# Patient Record
Sex: Male | Born: 1965 | Race: White | Hispanic: No | Marital: Married | Smoking: Never smoker
Health system: Southern US, Community
[De-identification: ages and names within clinical notes are randomized; demographics above are authoritative.]

## PROBLEM LIST (undated history)

## (undated) DIAGNOSIS — F32A Depression, unspecified: Secondary | ICD-10-CM

## (undated) DIAGNOSIS — I10 Essential (primary) hypertension: Secondary | ICD-10-CM

## (undated) HISTORY — DX: Essential (primary) hypertension: I10

---

## 1998-12-10 ENCOUNTER — Ambulatory Visit (HOSPITAL_COMMUNITY): Admission: RE | Admit: 1998-12-10 | Discharge: 1998-12-10 | Payer: Self-pay | Admitting: *Deleted

## 2000-05-03 ENCOUNTER — Ambulatory Visit (HOSPITAL_BASED_OUTPATIENT_CLINIC_OR_DEPARTMENT_OTHER): Admission: RE | Admit: 2000-05-03 | Discharge: 2000-05-03 | Payer: Self-pay | Admitting: General Surgery

## 2000-09-27 ENCOUNTER — Encounter: Payer: Self-pay | Admitting: Gastroenterology

## 2000-09-27 ENCOUNTER — Encounter: Admission: RE | Admit: 2000-09-27 | Discharge: 2000-09-27 | Payer: Self-pay | Admitting: Gastroenterology

## 2000-11-06 ENCOUNTER — Other Ambulatory Visit: Admission: RE | Admit: 2000-11-06 | Discharge: 2000-11-06 | Payer: Self-pay | Admitting: Gastroenterology

## 2003-06-18 ENCOUNTER — Ambulatory Visit (HOSPITAL_BASED_OUTPATIENT_CLINIC_OR_DEPARTMENT_OTHER): Admission: RE | Admit: 2003-06-18 | Discharge: 2003-06-18 | Payer: Self-pay | Admitting: Orthopedic Surgery

## 2019-01-13 ENCOUNTER — Other Ambulatory Visit: Payer: Self-pay | Admitting: Psychiatry

## 2019-01-13 NOTE — Telephone Encounter (Signed)
Not seen in epic to review

## 2019-01-13 NOTE — Telephone Encounter (Signed)
Need to review paper chart not seen in epic  

## 2019-01-13 NOTE — Telephone Encounter (Signed)
[facility] [address] requests refill Wellbutrin 300 mg XL as a 90-day supply patient stating he is taking it at night along with the Lexapro 20 mg nightly.  I call reminding him since last appointment May 16, 2018 that he did not expect more benefit from treatment but wanted to continue his medications to return in 3 to 12 months.  He last filled Xanax 07/22/2018 0.5 mg times daily as needed as a month supply.  The patient laughs on the phone and is pleasant stating he is doing enough and thanking me for checking on him stating he does wish to continue his medications stopping therapy 06/27/2018.  Bupropion 300 mg XL every night #90 no refill is sent to [facility] [address] medically necessary no contraindication.

## 2019-01-14 ENCOUNTER — Other Ambulatory Visit: Payer: Self-pay | Admitting: Psychiatry

## 2019-01-14 NOTE — Telephone Encounter (Signed)
Received request for his wellbutrin yesterday, and this one today. Not seen in epic

## 2019-01-14 NOTE — Telephone Encounter (Signed)
Phone call to [facility] yesterday about his request for Wellbutrin refill taking 300 mg XL nightly now today needing Lexapro 20 mg nightly sent as a 90-day supply also with no refill, 8 months since last appointment agreeing to follow-up in 3 to 12 months then, therefore 60-month supply is sent to [facility] [address] medically necessary no contraindication.

## 2019-03-31 ENCOUNTER — Other Ambulatory Visit: Payer: Self-pay | Admitting: Internal Medicine

## 2019-03-31 DIAGNOSIS — N289 Disorder of kidney and ureter, unspecified: Secondary | ICD-10-CM

## 2019-04-07 ENCOUNTER — Other Ambulatory Visit: Payer: Self-pay | Admitting: Psychiatry

## 2019-04-07 NOTE — Telephone Encounter (Signed)
Last office appointment was 05/16/2018 as he requested to continue the Lexapro 20 mg nightly but return in 3 to 12 months doing better when last discussed by phone but having stopped therapy.  Will provide a 90-day refill with pharmacy clarification last possible without appointment.

## 2019-04-07 NOTE — Telephone Encounter (Signed)
Not seen in epic, no scheduled appts

## 2019-04-08 ENCOUNTER — Other Ambulatory Visit: Payer: Self-pay | Admitting: Psychiatry

## 2019-04-09 ENCOUNTER — Ambulatory Visit
Admission: RE | Admit: 2019-04-09 | Discharge: 2019-04-09 | Disposition: A | Discharge status: Home / Self Care | Payer: Managed Care, Other (non HMO) | Source: Ambulatory Visit | Attending: Internal Medicine | Admitting: Internal Medicine

## 2019-04-09 DIAGNOSIS — N289 Disorder of kidney and ureter, unspecified: Secondary | ICD-10-CM

## 2019-06-29 ENCOUNTER — Other Ambulatory Visit: Payer: Self-pay | Admitting: Psychiatry

## 2019-07-01 ENCOUNTER — Other Ambulatory Visit: Payer: Self-pay | Admitting: Psychiatry

## 2019-07-28 IMAGING — US US RENAL
1 series · 14 of 25 positions shown · non-contrast
Comparison: None

CLINICAL DATA: Renal insufficiency

EXAM:
RENAL / URINARY TRACT ULTRASOUND COMPLETE

[Series 1: us renal · 0.28mm/px · 14 of 42 slices shown]
[im 1/42]
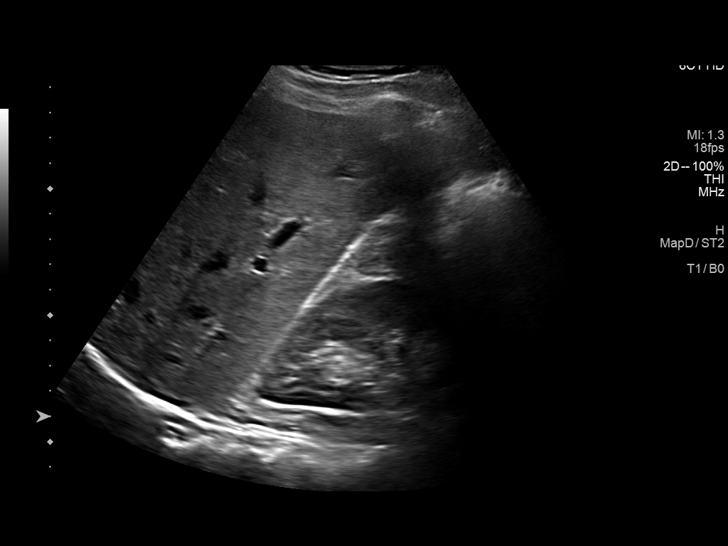
[im 4/42]
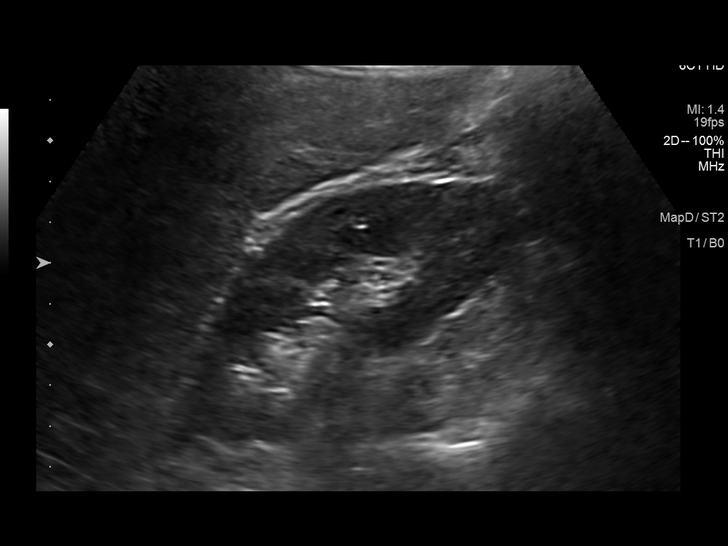
[im 7/42]
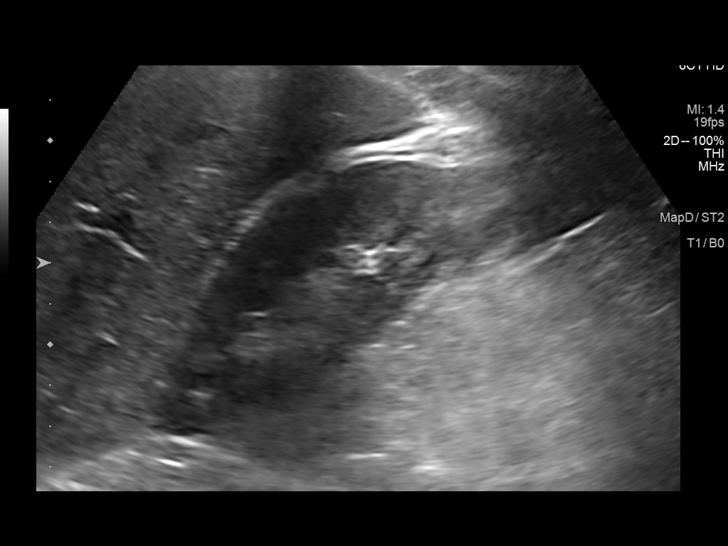
[im 11/42]
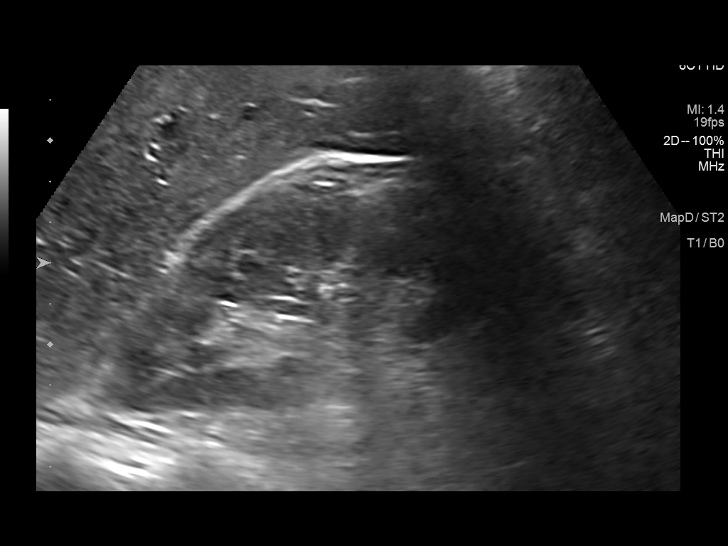
[im 14/42]
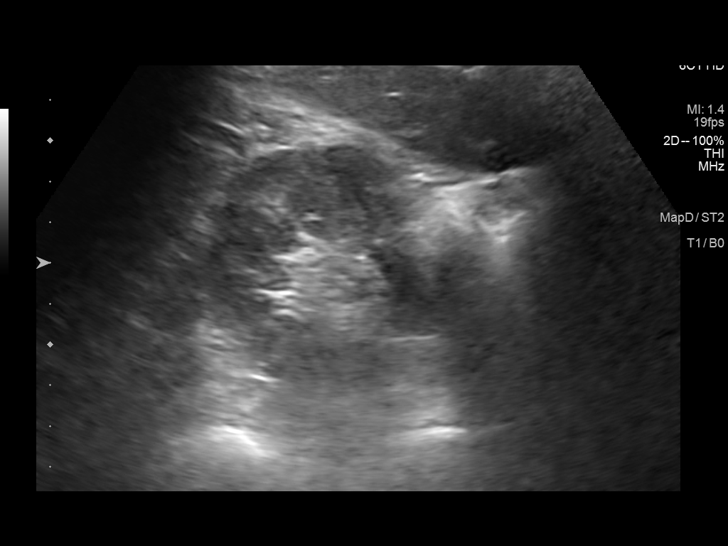
[im 16/42]
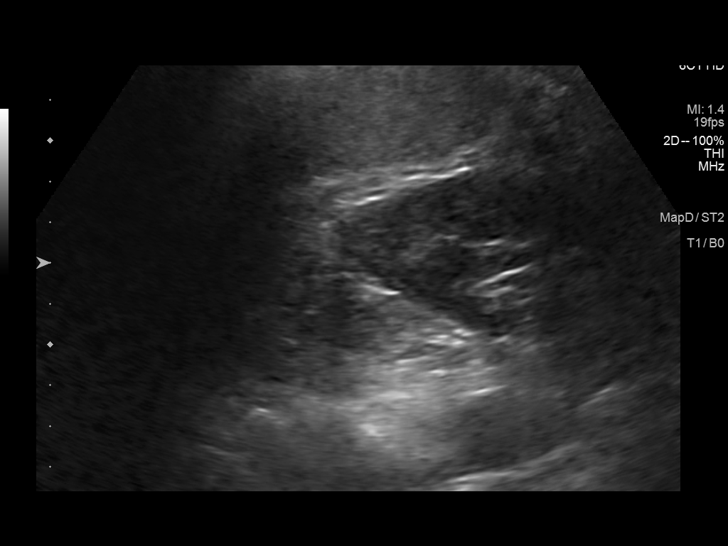
[im 19/42]
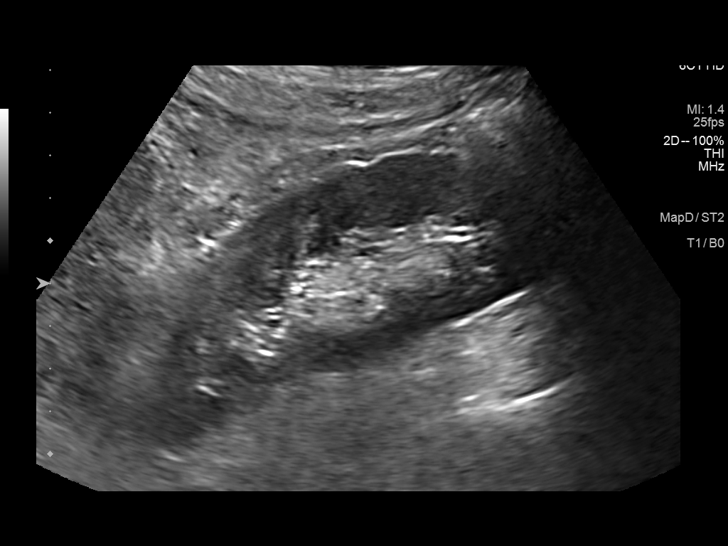
[im 23/42]
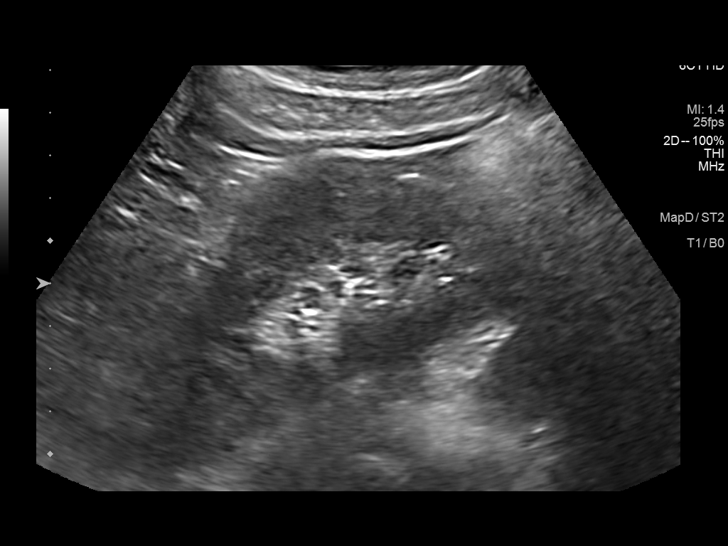
[im 26/42]
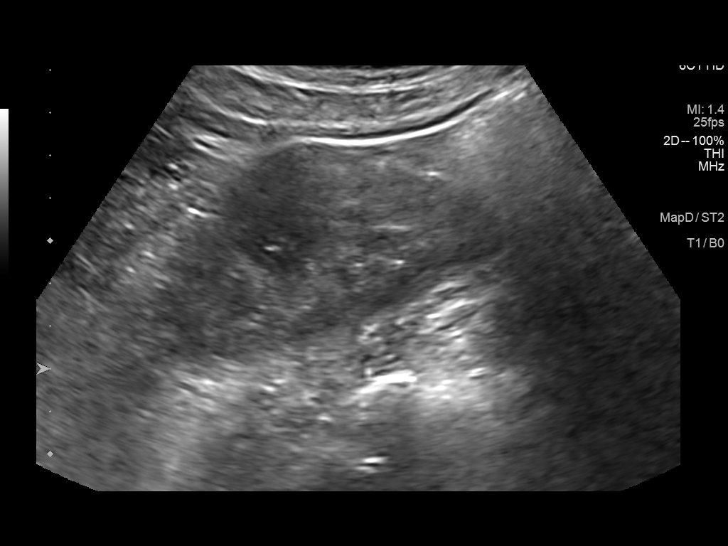
[im 28/42]
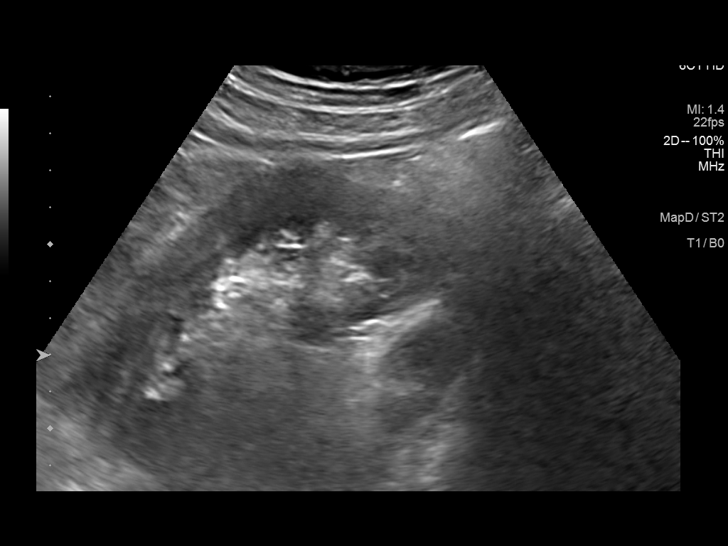
[im 31/42]
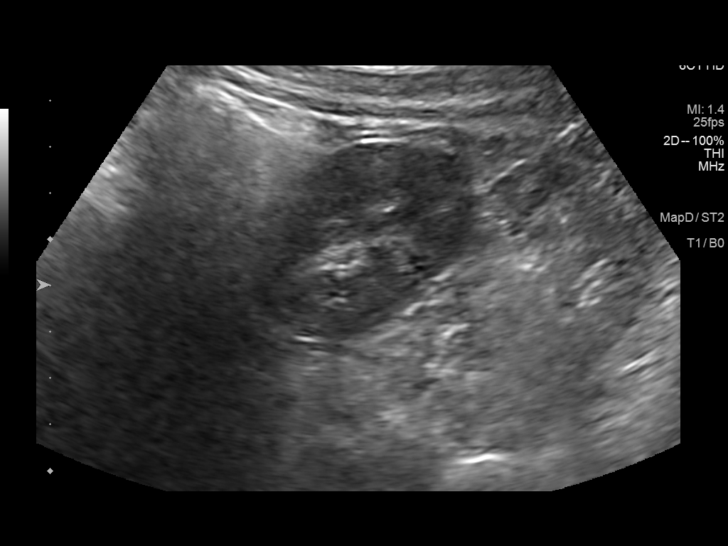
[im 35/42]
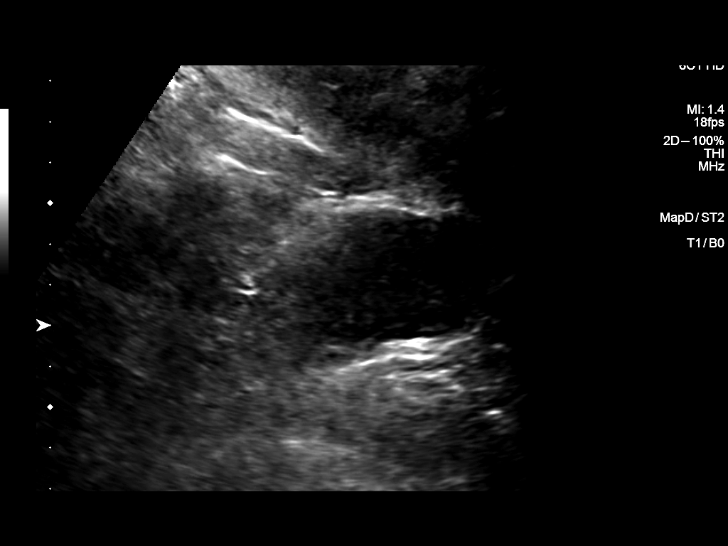
[im 38/42]
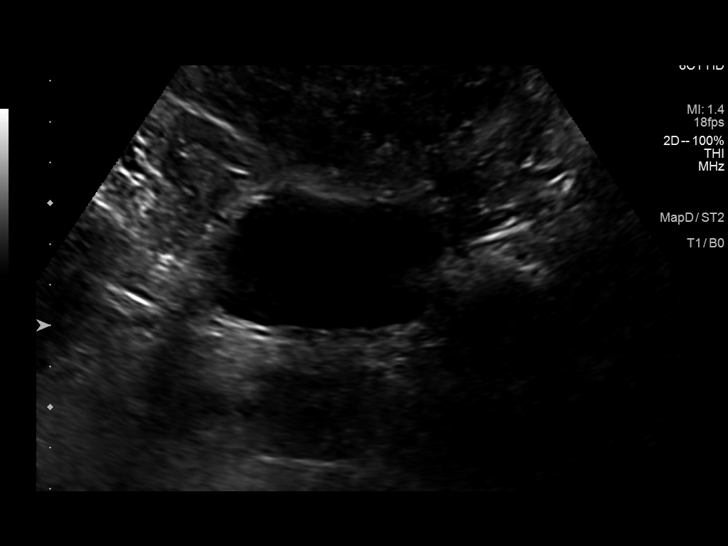
[im 42/42]
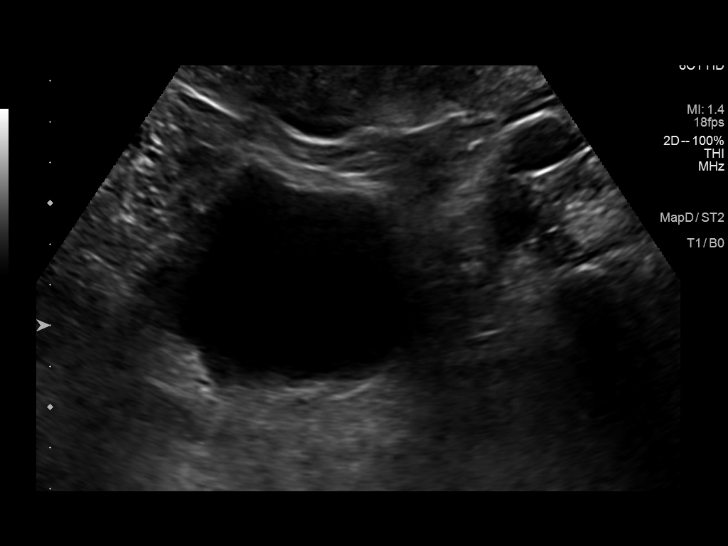

[14 of 25 positions shown; findings below may reference images not displayed]

FINDINGS: Right Kidney:

Renal measurements: 9.9 x 3.9 x 4.0 cm = volume: 81 mL. Normal
cortical thickness. Upper normal cortical echogenicity. No mass,
hydronephrosis or shadowing calcification.

Left Kidney:

Renal measurements: 10.7 x 4.3 x 5.4 cm = volume: 129 mL. Normal
cortical thickness. Upper normal cortical echogenicity. No mass,
hydronephrosis, or shadowing calcification.

Bladder:

Appears normal for degree of bladder distention.

Prostate gland measures 3.9 x 2.4 x 3.9 cm.
IMPRESSION: No renal sonographic abnormalities identified.

## 2019-10-10 ENCOUNTER — Ambulatory Visit: Payer: Managed Care, Other (non HMO) | Attending: Internal Medicine

## 2019-10-10 DIAGNOSIS — Z20822 Contact with and (suspected) exposure to covid-19: Secondary | ICD-10-CM

## 2019-10-12 ENCOUNTER — Telehealth: Payer: Self-pay | Admitting: Physician Assistant

## 2019-10-12 LAB — NOVEL CORONAVIRUS, NAA: SARS-CoV-2, NAA: DETECTED — AB

## 2019-10-12 NOTE — Telephone Encounter (Signed)
Patient was contacted regarding positive covid 19 result. Pt's symptoms include cold like symptoms which started around 10/08/19. No shortness of breath. We went over CDC guidelines for quarantine and ER precautions.    Cline Crock PA-C  [facility]

## 2020-07-22 ENCOUNTER — Encounter: Payer: Self-pay | Admitting: Psychiatry

## 2020-10-03 ENCOUNTER — Other Ambulatory Visit: Payer: Self-pay

## 2020-10-03 ENCOUNTER — Encounter (HOSPITAL_COMMUNITY): Payer: Self-pay | Admitting: *Deleted

## 2020-10-03 ENCOUNTER — Emergency Department (HOSPITAL_COMMUNITY): Payer: BLUE CROSS/BLUE SHIELD

## 2020-10-03 ENCOUNTER — Emergency Department (HOSPITAL_COMMUNITY)
Admission: EM | Admit: 2020-10-03 | Discharge: 2020-10-03 | Disposition: A | Discharge status: Home / Self Care | Payer: BLUE CROSS/BLUE SHIELD | Attending: Emergency Medicine | Admitting: Emergency Medicine

## 2020-10-03 DIAGNOSIS — R519 Headache, unspecified: Secondary | ICD-10-CM | POA: Diagnosis not present

## 2020-10-03 DIAGNOSIS — Z20822 Contact with and (suspected) exposure to covid-19: Secondary | ICD-10-CM | POA: Diagnosis not present

## 2020-10-03 DIAGNOSIS — R569 Unspecified convulsions: Secondary | ICD-10-CM | POA: Insufficient documentation

## 2020-10-03 DIAGNOSIS — R251 Tremor, unspecified: Secondary | ICD-10-CM | POA: Diagnosis not present

## 2020-10-03 HISTORY — DX: Depression, unspecified: F32.A

## 2020-10-03 LAB — CBC
HCT: 45.6 % (ref 39.0–52.0)
Hemoglobin: 14.8 g/dL (ref 13.0–17.0)
MCH: 29.5 pg (ref 26.0–34.0)
MCHC: 32.5 g/dL (ref 30.0–36.0)
MCV: 91 fL (ref 80.0–100.0)
Platelets: 229 10*3/uL (ref 150–400)
RBC: 5.01 MIL/uL (ref 4.22–5.81)
RDW: 12 % (ref 11.5–15.5)
WBC: 13.8 10*3/uL — ABNORMAL HIGH (ref 4.0–10.5)
nRBC: 0 % (ref 0.0–0.2)

## 2020-10-03 LAB — HEPATIC FUNCTION PANEL
ALT: 58 U/L — ABNORMAL HIGH (ref 0–44)
AST: 34 U/L (ref 15–41)
Albumin: 3.8 g/dL (ref 3.5–5.0)
Alkaline Phosphatase: 74 U/L (ref 38–126)
Bilirubin, Direct: 0.2 mg/dL (ref 0.0–0.2)
Indirect Bilirubin: 0.7 mg/dL (ref 0.3–0.9)
Total Bilirubin: 0.9 mg/dL (ref 0.3–1.2)
Total Protein: 6.3 g/dL — ABNORMAL LOW (ref 6.5–8.1)

## 2020-10-03 LAB — BASIC METABOLIC PANEL
Anion gap: 8 (ref 5–15)
BUN: 13 mg/dL (ref 6–20)
CO2: 24 mmol/L (ref 22–32)
Calcium: 9 mg/dL (ref 8.9–10.3)
Chloride: 104 mmol/L (ref 98–111)
Creatinine, Ser: 1.42 mg/dL — ABNORMAL HIGH (ref 0.61–1.24)
GFR, Estimated: 59 mL/min — ABNORMAL LOW (ref >60)
Glucose, Bld: 126 mg/dL — ABNORMAL HIGH (ref 70–99)
Potassium: 3.8 mmol/L (ref 3.5–5.1)
Sodium: 136 mmol/L (ref 135–145)

## 2020-10-03 LAB — ETHANOL: Alcohol, Ethyl (B): <10 mg/dL (ref <10)

## 2020-10-03 LAB — URINALYSIS, ROUTINE W REFLEX MICROSCOPIC
Bilirubin Urine: NEGATIVE
Glucose, UA: NEGATIVE mg/dL
Ketones, ur: NEGATIVE mg/dL
Leukocytes,Ua: NEGATIVE
Nitrite: NEGATIVE
Protein, ur: NEGATIVE mg/dL
Specific Gravity, Urine: 1.016 (ref 1.005–1.030)
pH: 5 (ref 5.0–8.0)

## 2020-10-03 LAB — TROPONIN I (HIGH SENSITIVITY)
Troponin I (High Sensitivity): 13 ng/L (ref <18)
Troponin I (High Sensitivity): 13 ng/L (ref <18)

## 2020-10-03 LAB — RAPID URINE DRUG SCREEN, HOSP PERFORMED
Amphetamines: NOT DETECTED
Barbiturates: NOT DETECTED
Benzodiazepines: NOT DETECTED
Cocaine: NOT DETECTED
Opiates: NOT DETECTED
Tetrahydrocannabinol: NOT DETECTED

## 2020-10-03 LAB — CBG MONITORING, ED: Glucose-Capillary: 114 mg/dL — ABNORMAL HIGH (ref 70–99)

## 2020-10-03 LAB — RESP PANEL BY RT-PCR (FLU A&B, COVID) ARPGX2
Influenza A by PCR: NEGATIVE
Influenza B by PCR: NEGATIVE
SARS Coronavirus 2 by RT PCR: NEGATIVE

## 2020-10-03 LAB — MAGNESIUM: Magnesium: 1.9 mg/dL (ref 1.7–2.4)

## 2020-10-03 MED ORDER — LACTATED RINGERS IV BOLUS
1000.0000 mL | Freq: Once | INTRAVENOUS | Status: AC
  Administered 2020-10-03: 1000 mL via INTRAVENOUS

## 2020-10-03 NOTE — ED Notes (Signed)
Updated wife [name].

## 2020-10-03 NOTE — Discharge Instructions (Addendum)
Per [facility] statutes, patients with seizures are not allowed to drive until  they have been seizure-free for six months. Use caution when using heavy equipment or power tools. Avoid working on ladders or at heights. Take showers instead of baths. Ensure the water temperature is not too high on the home water heater. Do not go swimming alone. When caring for infants or small children, sit down when holding, feeding, or changing them to minimize risk of injury to the child in the event you have a seizure.   Also, maintain good sleep hygiene. Avoid alcohol.   --> Call 911 and bring the patient back to the ED if:                   A.  The seizure lasts longer than 5 minutes.                  B.  The patient doesn't awaken shortly after the seizure             C.  The patient has new problems such as difficulty seeing, speaking or moving             D.  The patient was injured during the seizure             E.  The patient has a temperature over 102 F (39C)             F.  The patient vomited and now is having trouble breathing  Recommend follow-up with neurology on this matter.  Call to make an appointment. Make sure your primary care provider is also aware of this event.

## 2020-10-03 NOTE — ED Provider Notes (Signed)
[facility] EMERGENCY DEPARTMENT Provider Note   CSN: [id number] Arrival date & time: 10/03/20  1557     History Chief Complaint  Patient presents with  . Seizures    Jerome Hall is a 55 y.o. male.  HPI      Jerome Hall is a 55 y.o. male, with a history of depression, presenting to the ED with seizure versus syncope event that occurred around 3 PM today.  Patient states he remembers standing outside, experienced "flickering" in his vision, bent down, and this is the last thing he remembers. Patient's wife and 20 year old son added to the history.  Son states he saw the patient standing outside holding the water hose (patient clarifies that he was rinsing off the car), he went outside to see what his dad was doing, and found him lying on the ground with "stiff, generalized shaking with foaming from the mouth."  Son ran inside and came back with patient's wife.  Wife states patient was no longer shaking when she arrived at his side, but was not responding and seemed to have labored breathing with foaming around the mouth.  EMS reports patient confused upon their arrival, not following commands, and mildly combative.  EMS reports urinary incontinence as well.  Patient endorses a mild, generalized headache, but states he otherwise feels fine.  He denies any history of seizure.  Denies alcohol or illicit drug use.  States he has not eaten all day.   Denies fever/chills, recent illness, neck pain/stiffness, back pain, chest pain, shortness of breath, abdominal pain, numbness, weakness, or any other complaints.   Past Medical History:  Diagnosis Date  . Depression     There are no problems to display for this patient.   History reviewed. No pertinent surgical history.     No family history on file.  Social History   Tobacco Use  . Smoking status: Never Smoker  Substance Use Topics  . Alcohol use: Yes    Comment: occ  . Drug use: Never     Home Medications Prior to Admission medications   Medication Sig Start Date End Date Taking? Authorizing Provider  buPROPion (WELLBUTRIN XL) 300 MG 24 hr tablet TAKE 1 TABLET BY MOUTH EVERY DAY IN THE MORNING 04/08/19   Chauncey Mann, MD  escitalopram (LEXAPRO) 20 MG tablet TAKE 1 TABLET BY MOUTH EVERYDAY AT BEDTIME 04/07/19   Chauncey Mann, MD    Allergies    Patient has no known allergies.  Review of Systems   Review of Systems  Constitutional: Negative for chills, diaphoresis and fever.  Respiratory: Negative for cough and shortness of breath.   Cardiovascular: Negative for chest pain.  Gastrointestinal: Negative for abdominal pain, diarrhea, nausea and vomiting.  Genitourinary: Negative for dysuria.  Musculoskeletal: Negative for back pain and neck pain.  Neurological: Positive for seizures and headaches. Negative for speech difficulty.  All other systems reviewed and are negative.   Physical Exam Updated Vital Signs BP (!) 160/95   Pulse 97   Temp 98.1 F (36.7 C) (Oral)   Resp (!) 29   Ht 6\' 4"  (1.93 m)   Wt 94.3 kg   SpO2 95%   BMI 25.32 kg/m   Physical Exam Vitals and nursing note reviewed.  Constitutional:      General: He is not in acute distress.    Appearance: He is well-developed. He is not diaphoretic.  HENT:     Head: Normocephalic and atraumatic.  Comments: Scalp and face were examined without noted swelling, deformity, instability, or wounds.    Nose: Nose normal.     Mouth/Throat:     Mouth: Mucous membranes are moist.     Pharynx: Oropharynx is clear.     Comments: No noted intraoral trauma. Eyes:     Conjunctiva/sclera: Conjunctivae normal.  Cardiovascular:     Rate and Rhythm: Normal rate and regular rhythm.     Pulses: Normal pulses.          Radial pulses are 2+ on the right side and 2+ on the left side.       Posterior tibial pulses are 2+ on the right side and 2+ on the left side.     Heart sounds: Normal heart sounds.      Comments: Tactile temperature in the extremities appropriate and equal bilaterally. Pulmonary:     Effort: Pulmonary effort is normal. No respiratory distress.     Breath sounds: Normal breath sounds.  Abdominal:     Palpations: Abdomen is soft.     Tenderness: There is no abdominal tenderness. There is no guarding.  Musculoskeletal:     Cervical back: Neck supple.     Right lower leg: No edema.     Left lower leg: No edema.     Comments: Normal motor function intact in all extremities. No midline spinal tenderness.   Lymphadenopathy:     Cervical: No cervical adenopathy.  Skin:    General: Skin is warm and dry.     Capillary Refill: Capillary refill takes less than 2 seconds.  Neurological:     Mental Status: He is alert and oriented to person, place, and time.     Comments: No noted acute cognitive deficit. Sensation grossly intact to light touch in the extremities.   Grip strengths equal bilaterally.   Strength 5/5 in all extremities.  No gait disturbance.  Coordination intact.  Cranial nerves III-XII grossly intact.  Handles oral secretions without noted difficulty.  No noted phonation or speech deficit. No facial droop.   Psychiatric:        Mood and Affect: Mood and affect normal.        Speech: Speech normal.        Behavior: Behavior normal.     ED Results / Procedures / Treatments   Labs (all labs ordered are listed, but only abnormal results are displayed) Labs Reviewed  BASIC METABOLIC PANEL - Abnormal; Notable for the following components:      Result Value   Glucose, Bld 126 (*)    Creatinine, Ser 1.42 (*)    GFR, Estimated 59 (*)    All other components within normal limits  CBC - Abnormal; Notable for the following components:   WBC 13.8 (*)    All other components within normal limits  URINALYSIS, ROUTINE W REFLEX MICROSCOPIC - Abnormal; Notable for the following components:   APPearance CLOUDY (*)    Hgb urine dipstick SMALL (*)    Bacteria, UA RARE  (*)    All other components within normal limits  HEPATIC FUNCTION PANEL - Abnormal; Notable for the following components:   Total Protein 6.3 (*)    ALT 58 (*)    All other components within normal limits  CBG MONITORING, ED - Abnormal; Notable for the following components:   Glucose-Capillary 114 (*)    All other components within normal limits  RESP PANEL BY RT-PCR (FLU A&B, COVID) ARPGX2  ETHANOL  RAPID URINE DRUG  SCREEN, HOSP PERFORMED  MAGNESIUM  TROPONIN I (HIGH SENSITIVITY)  TROPONIN I (HIGH SENSITIVITY)    EKG EKG Interpretation  Date/Time:  Sunday October 03 2020 19:45:45 EST Ventricular Rate:  97 PR Interval:    QRS Duration: 99 QT Interval:  346 QTC Calculation: 440 R Axis:   89 Text Interpretation: Sinus rhythm nonspecific T waves. No old tracing to compare Confirmed by Sherwood Gambler [id number]) on 10/03/2020 8:02:30 PM   Radiology CT Head Wo Contrast  Result Date: 10/03/2020 CLINICAL DATA:  Witness seizure today.  Facial trauma. EXAM: CT HEAD WITHOUT CONTRAST CT CERVICAL SPINE WITHOUT CONTRAST TECHNIQUE: Multidetector CT imaging of the head and cervical spine was performed following the standard protocol without intravenous contrast. Multiplanar CT image reconstructions of the cervical spine were also generated. COMPARISON:  None. FINDINGS: CT HEAD FINDINGS Brain: No evidence of acute infarction, hemorrhage, hydrocephalus, extra-axial collection or mass lesion/mass effect. Vascular: No hyperdense vessel or unexpected calcification. Skull: Calvarium appears intact. No acute depressed skull fractures. Sinuses/Orbits: Paranasal sinuses and mastoid air cells are clear. Other: None. CT CERVICAL SPINE FINDINGS Alignment: Reversal of the usual cervical lordosis without anterior subluxation. Normal alignment of the posterior elements. Appearance is likely positional but muscle spasm could also have this appearance. Skull base and vertebrae: Skull base appears intact. No vertebral  compression deformities. No focal bone lesion or bone destruction. Bone cortex appears intact. Soft tissues and spinal canal: No prevertebral soft tissue swelling. No abnormal paraspinal soft tissue mass or infiltration. Disc levels: Mild disc space narrowing and endplate hypertrophic changes demonstrated at C3-4, C4-5, and C5-6 levels. Mild degenerative changes in the facet joints. Upper chest: Visualized lung apices are clear. Other: None. IMPRESSION: 1. No acute intracranial abnormalities. 2. Nonspecific reversal of the usual cervical lordosis. Mild degenerative changes in the cervical spine. No acute displaced fractures identified. Electronically Signed   By: Lucienne Capers M.D.   On: 10/03/2020 18:48   CT Cervical Spine Wo Contrast  Result Date: 10/03/2020 CLINICAL DATA:  Witness seizure today.  Facial trauma. EXAM: CT HEAD WITHOUT CONTRAST CT CERVICAL SPINE WITHOUT CONTRAST TECHNIQUE: Multidetector CT imaging of the head and cervical spine was performed following the standard protocol without intravenous contrast. Multiplanar CT image reconstructions of the cervical spine were also generated. COMPARISON:  None. FINDINGS: CT HEAD FINDINGS Brain: No evidence of acute infarction, hemorrhage, hydrocephalus, extra-axial collection or mass lesion/mass effect. Vascular: No hyperdense vessel or unexpected calcification. Skull: Calvarium appears intact. No acute depressed skull fractures. Sinuses/Orbits: Paranasal sinuses and mastoid air cells are clear. Other: None. CT CERVICAL SPINE FINDINGS Alignment: Reversal of the usual cervical lordosis without anterior subluxation. Normal alignment of the posterior elements. Appearance is likely positional but muscle spasm could also have this appearance. Skull base and vertebrae: Skull base appears intact. No vertebral compression deformities. No focal bone lesion or bone destruction. Bone cortex appears intact. Soft tissues and spinal canal: No prevertebral soft tissue  swelling. No abnormal paraspinal soft tissue mass or infiltration. Disc levels: Mild disc space narrowing and endplate hypertrophic changes demonstrated at C3-4, C4-5, and C5-6 levels. Mild degenerative changes in the facet joints. Upper chest: Visualized lung apices are clear. Other: None. IMPRESSION: 1. No acute intracranial abnormalities. 2. Nonspecific reversal of the usual cervical lordosis. Mild degenerative changes in the cervical spine. No acute displaced fractures identified. Electronically Signed   By: Lucienne Capers M.D.   On: 10/03/2020 18:48   DG Chest Portable 1 View  Result Date: 10/03/2020 CLINICAL DATA:  Syncope  versus seizure EXAM: PORTABLE CHEST 1 VIEW COMPARISON:  None. FINDINGS: Hyperinflation. Heart size and pulmonary vascularity are normal. Lungs are clear. No pleural effusions. No pneumothorax. Mediastinal contours appear intact. IMPRESSION: No active disease. Electronically Signed   By: Burman Nieves M.D.   On: 10/03/2020 20:21    Procedures Procedures (including critical care time)  Medications Ordered in ED Medications  lactated ringers bolus 1,000 mL (0 mLs Intravenous Stopped 10/03/20 2302)    ED Course  I have reviewed the triage vital signs and the nursing notes.  Pertinent labs & imaging results that were available during my care of the patient were reviewed by me and considered in my medical decision making (see chart for details).  Clinical Course as of 10/03/20 2312  Wynelle Link Oct 03, 2020  2112 Creatinine(!): 1.42 Patient states he thinks his last creatinine was 1.32. [SJ]    Clinical Course User Index [SJ] Devinn Voshell, Hillard Danker, PA-C   MDM Rules/Calculators/A&P                          Patient presents following report of seizure-like activity. Patient is nontoxic appearing, afebrile, not tachycardic, not tachypneic, not hypotensive, maintains excellent SPO2 on room air, and is in no apparent distress.  No focal neurologic deficits. I reviewed and interpreted  the patient's labs and radiological studies. Lab work overall unremarkable.  Slight elevation in ALT.  Patient instructed to have his retested by his PCP. Patient advised to follow-up with neurology and cardiology. The patient was given instructions for home care as well as return precautions. Patient voices understanding of these instructions, accepts the plan, and is comfortable with discharge.   Findings and plan of care discussed with attending physician, Pricilla Loveless, MD.    Vitals:   10/03/20 2145 10/03/20 2200 10/03/20 2220 10/03/20 2302  BP: (!) 148/87 132/75 (!) 154/90 (!) 147/90  Pulse: 90 89 89 88  Resp: (!) 24 17 16 16   Temp:   98.5 F (36.9 C) 97.9 F (36.6 C)  TempSrc:   Oral Oral  SpO2: 98% 96% 97% 100%  Weight:      Height:         Final Clinical Impression(s) / ED Diagnoses Final diagnoses:  Seizure-like activity [facility])    Rx / DC Orders ED Discharge Orders    None       [facility] 10/03/20 2343    2344, MD 10/04/20 1459

## 2020-10-03 NOTE — ED Triage Notes (Signed)
Pt here via [facility] for seizure.  Family saw pt seizing for 2 minutes, likely full body.  When [facility] arrived pt was uncooperative, confused - behavior lasted 45 min.  Pt given 4 zofran for nausea and epigastric pain. No hx of seizure.  Pt was incontinent of urine.  Cbg 82 Hr 100 ekg nsr 18 L AC  VS stable presently - pt ao x 4.  Pt in C-collar.

## 2020-10-05 ENCOUNTER — Ambulatory Visit (INDEPENDENT_AMBULATORY_CARE_PROVIDER_SITE_OTHER): Payer: BLUE CROSS/BLUE SHIELD | Admitting: Internal Medicine

## 2020-10-05 ENCOUNTER — Encounter: Payer: Self-pay | Admitting: Internal Medicine

## 2020-10-05 ENCOUNTER — Other Ambulatory Visit: Payer: Self-pay

## 2020-10-05 VITALS — BP 136/82 | HR 73 | Ht 76.0 in | Wt 202.0 lb

## 2020-10-05 DIAGNOSIS — E7800 Pure hypercholesterolemia, unspecified: Secondary | ICD-10-CM | POA: Diagnosis not present

## 2020-10-05 DIAGNOSIS — R55 Syncope and collapse: Secondary | ICD-10-CM | POA: Diagnosis not present

## 2020-10-05 DIAGNOSIS — I10 Essential (primary) hypertension: Secondary | ICD-10-CM

## 2020-10-05 NOTE — Progress Notes (Addendum)
Cardiology Office Note:    Date:  10/05/2020   ID:  Jerome Hall, DOB 1966-06-24, MRN [id number]  PCP:  Kirby Funk, MD  Cardiologist:  No primary care provider on file.  Electrophysiologist:  None   Referring MD: Kirby Funk, MD   Chief Complaint/Reason for Referral: Syncope vs. seizure  History of Present Illness:    Jerome Hall is a 55 y.o. male with a history of depression and HTN, and no other significant past cardiovascular history who presents with an episode of syncope vs seizure. He presents with his wife who provides additional history.  On 10/03/20 he had unpacked the car after a trip with his family. He then saw some dirt on the car and felt he should clean it. While he was running the water hose, he lost consciousness, and was found by his son who was wondering why the hose was running. Patient does not recall the fall and recalls next being in the ambulance. There was note of a prolonged return to baseline mental status, suggesting post-ictal phase. Evaluation in ED showed normal ECG, negative troponins, grossly normal electrolytes, mild renal dysfunction, normal Hgb and HCT, mild leukocytosis, normal PLT count. He denies preceding chest pain.   He had a similar episode 10 years ago. He describes this as "flickering of mind". He was playing basketball with son - thought it was too hot.   FHx: Mother strokes 58, total 3. No heart disease. No sudden death.  He and his wife have 1 adopted son.   He denies chest pain, chest pressure, dyspnea at rest or with exertion, palpitations, PND, orthopnea, or leg swelling. Denies cough, fever, chills. Denies nausea, vomiting. Denies daily dizziness or lightheadedness.   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   Past Medical History:  Diagnosis Date  . Depression     No past surgical history on file.  Current Medications: Current Meds  Medication Sig   . ALPRAZolam (XANAX) 0.5 MG tablet 1 tablet  . buPROPion (WELLBUTRIN XL) 300 MG 24 hr tablet TAKE 1 TABLET BY MOUTH EVERY DAY IN THE MORNING  . escitalopram (LEXAPRO) 20 MG tablet TAKE 1 TABLET BY MOUTH EVERYDAY AT BEDTIME  . telmisartan (MICARDIS) 40 MG tablet Take 40 mg by mouth daily.     Allergies:   Patient has no known allergies.   Social History   Tobacco Use  . Smoking status: Never Smoker  . Smokeless tobacco: Never Used  Substance Use Topics  . Alcohol use: Yes    Comment: occ  . Drug use: Never     Family History: The patient's family history is not on file.  ROS:   Please see the history of present illness.    All other systems reviewed and are negative.  EKGs/Labs/Other Studies Reviewed:    The following studies were reviewed today:  EKG:  NSR, nonspecific T wave abnl  I have independently reviewed the images from chest xray 10/03/20, no significant cardiac abnormality.  Recent Labs: 10/03/2020: ALT 58; BUN 13; Creatinine, Ser 1.42; Hemoglobin 14.8; Magnesium 1.9; Platelets 229; Potassium 3.8; Sodium 136  Recent Lipid Panel No results found for: CHOL, TRIG, HDL, CHOLHDL, VLDL, LDLCALC, LDLDIRECT  Physical Exam:    VS:  BP 136/82   Pulse 73   Ht 6\' 4"  (1.93 m)   Wt 202 lb (91.6 kg)   SpO2 96%   BMI 24.59 kg/m     Wt  Readings from Last 5 Encounters:  10/05/20 202 lb (91.6 kg)  10/03/20 208 lb (94.3 kg)    Constitutional: No acute distress, but appears fatigued Eyes: sclera non-icteric, normal conjunctiva and lids ENMT: normal dentition, moist mucous membranes Cardiovascular: regular rhythm, normal rate, no murmurs. S1 and S2 normal. Radial pulses normal bilaterally. No jugular venous distention.  Respiratory: clear to auscultation bilaterally GI : normal bowel sounds, soft and nontender. No distention.   MSK: extremities warm, well perfused. No edema.  NEURO: grossly nonfocal exam, moves all extremities. PSYCH: alert and oriented x 3, normal mood  and affect.   ASSESSMENT:    1. Syncope and collapse   2. Essential hypertension    PLAN:    Syncope and collapse  - EKG 12-Lead - CARDIAC EVENT MONITOR - ECHOCARDIOGRAM COMPLETE  Will complete a cardiovascular evaluation given two lifetime episodes of syncope and unclear cause at this time. Echo, 30 day event monitor. ECG today with nonspecific changes. He is requesting to be out of work until cardiovascular and neurologic evaluation are complete. We are happy to recommend return to work with restrictions if this is possible in his profession.   Essential hypertension - continue telmisartan 40 mg daily, BP with reasonable control  HLD - LDL 121, above goal (goal is <70). Recommend diet and lifestyle modification and recheck. Will consider CAC scoring +/- statin therapy.   Total time of encounter: 60 minutes total time of encounter, including 35 minutes spent in face-to-face patient care on the date of this encounter. This time includes coordination of care and counseling regarding above mentioned problem list. Remainder of non-face-to-face time involved reviewing chart documents/testing relevant to the patient encounter and documentation in the medical record. I have independently reviewed documentation from referring provider.   Cherlynn Kaiser, MD [place]  [facility]    Medication Adjustments/Labs and Tests Ordered: Current medicines are reviewed at length with the patient today.  Concerns regarding medicines are outlined above.   Orders Placed This Encounter  Procedures  . CARDIAC EVENT MONITOR  . EKG 12-Lead  . ECHOCARDIOGRAM COMPLETE    No orders of the defined types were placed in this encounter.   Patient Instructions  Medication Instructions:  No Changes In Medications at this time.  *If you need a refill on your cardiac medications before your next appointment, please call your pharmacy*  Testing/Procedures: Your physician has requested that you have  an echocardiogram. Echocardiography is a painless test that uses sound waves to create images of your heart. It provides your doctor with information about the size and shape of your heart and how well your heart's chambers and valves are working. You may receive an ultrasound enhancing agent through an IV if needed to better visualize your heart during the echo.This procedure takes approximately one hour. There are no restrictions for this procedure. This will take place at the [address].   [employer] Cardiac Event Monitor Instructions Your physician has requested you wear your cardiac event monitor for  30 days, (1-30). [employer] may call or text to confirm a shipping address. The monitor will be sent to a land address via UPS. [employer] will not ship a monitor to a PO BOX. It typically takes 3-5 days to receive your monitor after it has been enrolled. [employer] will assist with USPS tracking if your package is delayed. The telephone number for [employer] is [phone]. Once you have received your monitor, please review the enclosed instructions. Instruction tutorials can  also be viewed under help and settings on the enclosed cell phone. Your monitor has already been registered assigning a specific monitor serial # to you.  Applying the monitor Remove cell phone from case and turn it on. The cell phone works as IT consultant and needs to be within [employer] of you at all times. The cell phone will need to be charged on a daily basis. We recommend you plug the cell phone into the enclosed charger at your bedside table every night.  Monitor batteries: You will receive two monitor batteries labelled #1 and #2. These are your recorders. Plug battery #2 onto the second connection on the enclosed charger. Keep one battery on the charger at all times. This will keep the monitor battery deactivated. It will also keep it fully charged for when you need to switch your monitor  batteries. A small light will be blinking on the battery emblem when it is charging. The light on the battery emblem will remain on when the battery is fully charged.  Open package of a Monitor strip. Insert battery #1 into black hood on strip and gently squeeze monitor battery onto connection as indicated in instruction booklet. Set aside while preparing skin.  Choose location for your strip, vertical or horizontal, as indicated in the instruction booklet. Shave to remove all hair from location. There cannot be any lotions, oils, powders, or colognes on skin where monitor is to be applied. Wipe skin clean with enclosed Saline wipe. Dry skin completely.  Peel paper labeled #1 off the back of the Monitor strip exposing the adhesive. Place the monitor on the chest in the vertical or horizontal position shown in the instruction booklet. One arrow on the monitor strip must be pointing upward. Carefully remove paper labeled #2, attaching remainder of strip to your skin. Try not to create any folds or wrinkles in the strip as you apply it.  Firmly press and release the circle in the center of the monitor battery. You will hear a small beep. This is turning the monitor battery on. The heart emblem on the monitor battery will light up every 5 seconds if the monitor battery in turned on and connected to the patient securely. Do not push and hold the circle down as this turns the monitor battery off. The cell phone will locate the monitor battery. A screen will appear on the cell phone checking the connection of your monitor strip. This may read poor connection initially but change to good connection within the next minute. Once your monitor accepts the connection you will hear a series of 3 beeps followed by a climbing crescendo of beeps. A screen will appear on the cell phone showing the two monitor strip placement options. Touch the picture that demonstrates where you applied the monitor  strip.  Your monitor strip and battery are waterproof. You are able to shower, bathe, or swim with the monitor on. They just ask you do not submerge deeper than 3 feet underwater. We recommend removing the monitor if you are swimming in a lake, river, or ocean.  Your monitor battery will need to be switched to a fully charged monitor battery approximately once a week. The cell phone will alert you of an action which needs to be made.  On the cell phone, tap for details to reveal connection status, monitor battery status, and cell phone battery status. The green dots indicates your monitor is in good status. A red dot indicates there is something that needs  your attention.  To record a symptom, click the circle on the monitor battery. In 30-60 seconds a list of symptoms will appear on the cell phone. Select your symptom and tap save. Your monitor will record a sustained or significant arrhythmia regardless of you clicking the button. Some patients do not feel the heart rhythm irregularities. [employer] will notify us of any serious or critical events.  Refer to instruction booklet for instructions on switching batteries, changing strips, the Do not disturb or Pause features, or any additional questions.  Call [employer] at [phone], to confirm your monitor is transmitting and record your baseline. They will answer any questions you may have regarding the monitor instructions at that time.  Returning the monitor to [employer] Place all equipment back into blue box. Peel off strip of paper to expose adhesive and close box securely. There is a prepaid UPS shipping label on this box. Drop in a UPS drop box, or at a UPS facility like [employer]. You may also contact [employer] to arrange UPS to pick up monitor package at your home.  Follow-Up: At [facility], you and your health needs are our priority.  As part of our continuing mission to provide you with exceptional heart care, we  have created designated Provider Care Teams.  These Care Teams include your primary Cardiologist (physician) and Advanced Practice Providers (APPs -  Physician Assistants and Nurse Practitioners) who all work together to provide you with the care you need, when you need it.  Your next appointment:   6 week(s)  The format for your next appointment:   In Person  Provider:   [name], MD

## 2020-10-05 NOTE — Patient Instructions (Signed)
Medication Instructions:  No Changes In Medications at this time.  *If you need a refill on your cardiac medications before your next appointment, please call your pharmacy*  Testing/Procedures: Your physician has requested that you have an echocardiogram. Echocardiography is a painless test that uses sound waves to create images of your heart. It provides your doctor with information about the size and shape of your heart and how well your heart's chambers and valves are working. You may receive an ultrasound enhancing agent through an IV if needed to better visualize your heart during the echo.This procedure takes approximately one hour. There are no restrictions for this procedure. This will take place at the [address], [address].   [employer] Cardiac Event Monitor Instructions Your physician has requested you wear your cardiac event monitor for  30 days, (1-30). [employer] may call or text to confirm a shipping address. The monitor will be sent to a land address via UPS. [employer] will not ship a monitor to a PO BOX. It typically takes 3-5 days to receive your monitor after it has been enrolled. [employer] will assist with USPS tracking if your package is delayed. The telephone number for [employer] is [phone]. Once you have received your monitor, please review the enclosed instructions. Instruction tutorials can also be viewed under help and settings on the enclosed cell phone. Your monitor has already been registered assigning a specific monitor serial # to you.  Applying the monitor Remove cell phone from case and turn it on. The cell phone works as IT consultant and needs to be within [employer] of you at all times. The cell phone will need to be charged on a daily basis. We recommend you plug the cell phone into the enclosed charger at your bedside table every night.  Monitor batteries: You will receive two monitor batteries labelled #1 and #2. These are your recorders.  Plug battery #2 onto the second connection on the enclosed charger. Keep one battery on the charger at all times. This will keep the monitor battery deactivated. It will also keep it fully charged for when you need to switch your monitor batteries. A small light will be blinking on the battery emblem when it is charging. The light on the battery emblem will remain on when the battery is fully charged.  Open package of a Monitor strip. Insert battery #1 into black hood on strip and gently squeeze monitor battery onto connection as indicated in instruction booklet. Set aside while preparing skin.  Choose location for your strip, vertical or horizontal, as indicated in the instruction booklet. Shave to remove all hair from location. There cannot be any lotions, oils, powders, or colognes on skin where monitor is to be applied. Wipe skin clean with enclosed Saline wipe. Dry skin completely.  Peel paper labeled #1 off the back of the Monitor strip exposing the adhesive. Place the monitor on the chest in the vertical or horizontal position shown in the instruction booklet. One arrow on the monitor strip must be pointing upward. Carefully remove paper labeled #2, attaching remainder of strip to your skin. Try not to create any folds or wrinkles in the strip as you apply it.  Firmly press and release the circle in the center of the monitor battery. You will hear a small beep. This is turning the monitor battery on. The heart emblem on the monitor battery will light up every 5 seconds if the monitor battery in turned on and connected to the patient  securely. Do not push and hold the circle down as this turns the monitor battery off. The cell phone will locate the monitor battery. A screen will appear on the cell phone checking the connection of your monitor strip. This may read poor connection initially but change to good connection within the next minute. Once your monitor accepts the connection you  will hear a series of 3 beeps followed by a climbing crescendo of beeps. A screen will appear on the cell phone showing the two monitor strip placement options. Touch the picture that demonstrates where you applied the monitor strip.  Your monitor strip and battery are waterproof. You are able to shower, bathe, or swim with the monitor on. They just ask you do not submerge deeper than 3 feet underwater. We recommend removing the monitor if you are swimming in a lake, river, or ocean.  Your monitor battery will need to be switched to a fully charged monitor battery approximately once a week. The cell phone will alert you of an action which needs to be made.  On the cell phone, tap for details to reveal connection status, monitor battery status, and cell phone battery status. The green dots indicates your monitor is in good status. A red dot indicates there is something that needs your attention.  To record a symptom, click the circle on the monitor battery. In 30-60 seconds a list of symptoms will appear on the cell phone. Select your symptom and tap save. Your monitor will record a sustained or significant arrhythmia regardless of you clicking the button. Some patients do not feel the heart rhythm irregularities. [employer] will notify us of any serious or critical events.  Refer to instruction booklet for instructions on switching batteries, changing strips, the Do not disturb or Pause features, or any additional questions.  Call [employer] at [phone], to confirm your monitor is transmitting and record your baseline. They will answer any questions you may have regarding the monitor instructions at that time.  Returning the monitor to [employer] Place all equipment back into blue box. Peel off strip of paper to expose adhesive and close box securely. There is a prepaid UPS shipping label on this box. Drop in a UPS drop box, or at a UPS facility like [employer]. You may also contact  [employer] to arrange UPS to pick up monitor package at your home.  Follow-Up: At [facility], you and your health needs are our priority.  As part of our continuing mission to provide you with exceptional heart care, we have created designated Provider Care Teams.  These Care Teams include your primary Cardiologist (physician) and Advanced Practice Providers (APPs -  Physician Assistants and Nurse Practitioners) who all work together to provide you with the care you need, when you need it.  Your next appointment:   6 week(s)  The format for your next appointment:   In Person  Provider:   [name], MD

## 2020-10-06 ENCOUNTER — Encounter: Payer: Self-pay | Admitting: *Deleted

## 2020-10-06 NOTE — Progress Notes (Signed)
Patient ID: Jerome Hall, male   DOB: April 15, 1966, 55 y.o.   MRN: [id number] Patient enrolled for [employer] to ship a 30 day cardiac event monitor to his home.

## 2020-10-12 ENCOUNTER — Encounter (INDEPENDENT_AMBULATORY_CARE_PROVIDER_SITE_OTHER): Payer: BLUE CROSS/BLUE SHIELD

## 2020-10-12 ENCOUNTER — Other Ambulatory Visit: Payer: Self-pay

## 2020-10-12 ENCOUNTER — Other Ambulatory Visit (HOSPITAL_COMMUNITY): Payer: Self-pay

## 2020-10-12 DIAGNOSIS — R55 Syncope and collapse: Secondary | ICD-10-CM | POA: Diagnosis not present

## 2020-10-14 ENCOUNTER — Ambulatory Visit: Payer: BLUE CROSS/BLUE SHIELD | Admitting: Neurology

## 2020-10-14 ENCOUNTER — Other Ambulatory Visit: Payer: Self-pay

## 2020-10-14 ENCOUNTER — Encounter: Payer: Self-pay | Admitting: Neurology

## 2020-10-14 VITALS — BP 129/87 | HR 92 | Ht 76.0 in | Wt 210.6 lb

## 2020-10-14 DIAGNOSIS — R569 Unspecified convulsions: Secondary | ICD-10-CM

## 2020-10-14 DIAGNOSIS — R4020 Unspecified coma: Secondary | ICD-10-CM | POA: Diagnosis not present

## 2020-10-14 DIAGNOSIS — R55 Syncope and collapse: Secondary | ICD-10-CM

## 2020-10-14 NOTE — Progress Notes (Signed)
[facility] [address] [place]. [place] [address] [phone]       OFFICE CONSULT NOTE  [name] Date of Birth:  Mar 01, 1966 Medical Record Number:  [id number]   Referring MD: Pricilla Loveless  Reason for Referral: Episode of loss of consciousness HPI: [name] is a 55 year old Caucasian male seen today for initial office consultation visit for episode of loss of consciousness.  History is obtained from the patient as well as his wife is present at the bedside and review of electronic medical records and I personally reviewed available pertinent imaging films in PACS.  He has past medical history of hypertension and depression.  He presented to [facility], ER on 10/03/2020 with an episode of loss of consciousness and involuntary jerking.  Patient states he remember having driven from [facility] to [place] which is an hour and a half.  When he parked his car he noticed that it was dirty and he states he went down to turn the house to clean the car when he started feeling that his mind was racing and the next thing he remembers is waking up in the ambulance on the right to the hospital.  His son was inside came out and noticed that he was lying on the ground foaming at his mouth and trembling in his extremities.  His wife subsequently arrived and noticed mouth frothing and the tremoring stopped.  Patient remained unresponsive with eyes open with a glazed look on his face and staring into the distance.  He was slow to respond.  After EMS arrived he is appeared confused and disoriented.  Patient's did not have memory of the event till he reached the emergency room and he can recall details from that point onwards but he cannot recall any details for the preceding hour and a half or so.  We will complain of mild headache later.  In the ER he had CT scan of the brain which was unremarkable and CT scan of cervical spine showed only minor disc degenerative changes  without any fracture.  Lab work was significant for slightly elevated white count of 13.8 and creatinine of 1.42.  Patient admitted that he had been sleep deprived and has been under significant stress as he had 3 criminal trials coming up in which she has to go in front of a judge.  He also had not been eating and drinking well.  Patient states she has had no previous episodes of passing out but he is haddock couple of other episodes where he had somewhat similar feeling but did not pass out and was able to avoid passing out (stopping what he was doing and sitting down.  These occurred twice on the basketball court once in 2012 and another few months later.  He sat down on both occasions and did not lose consciousness and did not notice that he was slightly disoriented and could not focus still is feeling past.  Did not seek medical evaluation at that time.  Patient has since seen cardiologist Dr. [name] was ordered a 30-day heart monitor but felt this episode was unlikely to be cardiac related since he has no significant cardiac history.  Patient denies any prior history of palpitations, chest pain with significant cardiac issues.  There is no prior history of seizures, significant head injury with loss of consciousness family history of epilepsy.  He has no prior history of strokes, TIAs, migraines or significant neurological problems.  He does have history  of depression and takes Xanax and Wellbutrin and admits that he had not been taking Xanax on a predictable and regular basis and takes it when he needs it and of late and not taking it.  He has since also talked to Dr. Valentina Lucks his primary physician about changing Wellbutrin to alternative depressant medication  ROS:   14 system review of systems is positive for loss of consciousness, memory loss, sleepiness all other systems negative PMH:  Past Medical History:  Diagnosis Date  . Depression   . Hypertension     Social History:  Social  History   Socioeconomic History  . Marital status: Married    Spouse name: [name]  . Number of children: Not on file  . Years of education: Not on file  . Highest education level: Not on file  Occupational History  . Occupation: full time 10/14/20  Tobacco Use  . Smoking status: Never Smoker  . Smokeless tobacco: Never Used  Substance and Sexual Activity  . Alcohol use: Yes    Comment: occ  . Drug use: Never  . Sexual activity: Never    Partners: Female  Other Topics Concern  . Not on file  Social History Narrative   Lives with wife and son   Right Handed   Drinks 4-6 cups caffeine   Social Determinants of Health   Financial Resource Strain: Not on file  Food Insecurity: Not on file  Transportation Needs: Not on file  Physical Activity: Not on file  Stress: Not on file  Social Connections: Not on file  Intimate Partner Violence: Not on file    Medications:   Current Outpatient Medications on File Prior to Visit  Medication Sig Dispense Refill  . ALPRAZolam (XANAX) 0.5 MG tablet 1 tablet    . buPROPion (WELLBUTRIN XL) 300 MG 24 hr tablet TAKE 1 TABLET BY MOUTH EVERY DAY IN THE MORNING 90 tablet 0  . escitalopram (LEXAPRO) 20 MG tablet TAKE 1 TABLET BY MOUTH EVERYDAY AT BEDTIME 90 tablet 0  . telmisartan (MICARDIS) 40 MG tablet Take 40 mg by mouth daily.     No current facility-administered medications on file prior to visit.    Allergies:  No Known Allergies  Physical Exam General: well developed, well nourished middle-aged Caucasian male, seated, in no evident distress Head: head normocephalic and atraumatic.   Neck: supple with no carotid or supraclavicular bruits Cardiovascular: regular rate and rhythm, no murmurs Musculoskeletal: no deformity Skin:  no rash/petichiae Vascular:  Normal pulses all extremities  Neurologic Exam Mental Status: Awake and fully alert. Oriented to place and time. Recent and remote memory intact. Attention span, concentration and  fund of knowledge appropriate. Mood and affect appropriate.  Diminished recall 2/3.  Able to name 16 animals which can walk on  4 legs Cranial Nerves: Fundoscopic exam reveals sharp disc margins. Pupils equal, briskly reactive to light. Extraocular movements full without nystagmus. Visual fields full to confrontation. Hearing intact. Facial sensation intact. Face, tongue, palate moves normally and symmetrically.  Motor: Normal bulk and tone. Normal strength in all tested extremity muscles. Sensory.: intact to touch , pinprick , position and vibratory sensation.  Coordination: Rapid alternating movements normal in all extremities. Finger-to-nose and heel-to-shin performed accurately bilaterally. Gait and Station: Arises from chair without difficulty. Stance is normal. Gait demonstrates normal stride length and balance . Able to heel, toe and tandem walk without difficulty.  Reflexes: 1+ and symmetric. Toes downgoing.       ASSESSMENT: 55 year old Caucasian male  with episode of brief loss of consciousness followed by memory loss and tiredness likely seizure with postictal state versus prolonged convulsive syncope.  Doubt TIA or stroke     PLAN: I had a long discussion with the patient and his wife regarding his episode of loss of consciousness followed by brief memory loss and discussed differential diagnosis including convulsive syncope versus seizure and evaluation and treatment plan.  I recommend we check EEG and MRI scan of the brain.  Continue ongoing cardiac monitoring.  I advised the patient to avoid seizure provoking stimuli like sleep deprivation, extremes of activities, exertion and abrupt discontinuation of medications.  I recommend he see his primary physician to discuss changing Wellbutrin to alternative anxiolytic/depression medication because of its seizure lowering threshold.  We will hold off on a trial of anticonvulsants at the present time given that this is his initial episode and  may have been provoked by sleep deprivation and stress.  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  Greater than 50% time during this prolonged 50-minute consultation visit was spent on counseling and coordination of care about his episode of loss of consciousness and discussion about differential diagnosis, seizure evaluation treatment and answering questions. Delia Heady, MD Note: This document was prepared with digital dictation and possible smart phrase technology. Any transcriptional errors that result from this process are unintentional.

## 2020-10-14 NOTE — Patient Instructions (Signed)
I had a long discussion with the patient and his wife regarding his episode of loss of consciousness followed by brief memory loss and discussed differential diagnosis including convulsive syncope versus seizure and evaluation and treatment plan.  I recommend we check EEG and MRI scan of the brain.  Continue ongoing cardiac monitoring.  I advised the patient to avoid seizure provoking stimuli like sleep deprivation, extremes of activities, exertion and abrupt discontinuation of medications.  I recommend he see his primary physician to discuss changing Wellbutrin to alternative anxiolytic/depression medication because of its seizure lowering threshold.  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 Seizure, Adult A seizure is a sudden burst of abnormal electrical and chemical activity in the brain. Seizures usually last from 30 seconds to 2 minutes.  What are the causes? Common causes of this condition include:  Fever or infection.  Problems that affect the brain. These may include: ? A brain or head injury. ? Bleeding in the brain. ? A brain tumor.  Low levels of blood sugar or salt.  Kidney problems or liver problems.  Conditions that are passed from parent to child (are inherited).  Problems with a substance, such as: ? Having a reaction to a drug or a medicine. ? Stopping the use of a substance all of a sudden (withdrawal).  A stroke.  Disorders that affect how you develop. Sometimes, the cause may not be known.  What increases the risk?  Having someone in your family who has epilepsy. In this condition, seizures happen again and again over time. They have no clear cause.  Having had a tonic-clonic seizure before. This type of seizure causes you to: ? Tighten the muscles of the whole body. ? Lose  consciousness.  Having had a head injury or strokes before.  Having had a lack of oxygen at birth. What are the signs or symptoms? There are many types of seizures. The symptoms vary depending on the type of seizure you have. Symptoms during a seizure  Shaking that you cannot control (convulsions) with fast, jerky movements of muscles.  Stiffness of the body.  Breathing problems.  Feeling mixed up (confused).  Staring or not responding to sound or touch.  Head nodding.  Eyes that blink, flutter, or move fast.  Drooling, grunting, or making clicking sounds with your mouth  Losing control of when you pee or poop. Symptoms before a seizure  Feeling afraid, nervous, or worried.  Feeling like you may vomit.  Feeling like: ? You are moving when you are not. ? Things around you are moving when they are not.  Feeling like you saw or heard something before (dj vu).  Odd tastes or smells.  Changes in how you see. You may see flashing lights or spots. Symptoms after a seizure  Feeling confused.  Feeling sleepy.  Headache.  Sore muscles. How is this treated? If your seizure stops on its own, you will not need treatment. If your seizure lasts longer than 5 minutes, you will normally need treatment. Treatment may include:  Medicines given through an IV tube.  Avoiding things, such as medicines, that are known to cause your seizures.  Medicines to prevent seizures.  A device to prevent or control seizures.  Surgery.  A diet low in carbohydrates and high in fat (ketogenic diet). Follow these  instructions at home: Medicines  Take over-the-counter and prescription medicines only as told by your doctor.  Avoid foods or drinks that may keep your medicine from working, such as alcohol. Activity  Follow instructions about driving, swimming, or doing things that would be dangerous if you had another seizure. Wait until your doctor says it is safe for you to do these  things.  If you live in the [place] ask your local department of motor vehicles when you can drive.  Get a lot of rest. Teaching others  Teach friends and family what to do when you have a seizure. They should: ? Help you get down to the ground. ? Protect your head and body. ? Loosen any clothing around your neck. ? Turn you on your side. ? Know whether or not you need emergency care. ? Stay with you until you are better.  Also, tell them what not to do if you have a seizure. Tell them: ? They should not hold you down. ? They should not put anything in your mouth.   General instructions  Avoid anything that gives you seizures.  Keep a seizure diary. Write down: ? What you remember about each seizure. ? What you think caused each seizure.  Keep all follow-up visits. Contact a doctor if:  You have another seizure or seizures. Call the doctor each time you have a seizure.  The pattern of your seizures changes.  You keep having seizures with treatment.  You have symptoms of being sick or having an infection.  You are not able to take your medicine. Get help right away if:  You have any of these problems: ? A seizure that lasts longer than 5 minutes. ? Many seizures in a row and you do not feel better between seizures. ? A seizure that makes it harder to breathe. ? A seizure and you can no longer speak or use part of your body.  You do not wake up right after a seizure.  You get hurt during a seizure.  You feel confused or have pain right after a seizure. These symptoms may be an emergency. Get help right away. Call your local emergency services (911 in the [place]  Do not wait to see if the symptoms will go away.  Do not drive yourself to the hospital. Summary  A seizure is a sudden burst of abnormal electrical and chemical activity in the brain. Seizures normally last from 30 seconds to 2 minutes.  Causes of seizures include illness, injury to the head, low  levels of blood sugar or salt, and certain conditions.  Most seizures will stop on their own in less than 5 minutes. Seizures that last longer than 5 minutes are a medical emergency and need treatment right away.  Many medicines are used to treat seizures. Take over-the-counter and prescription medicines only as told by your doctor. This information is not intended to replace advice given to you by your health care provider. Make sure you discuss any questions you have with your health care provider. Document Revised: 03/26/2020 Document Reviewed: 03/26/2020 Elsevier Patient Education  2021 [employer].

## 2020-10-15 ENCOUNTER — Other Ambulatory Visit: Payer: Self-pay

## 2020-10-15 ENCOUNTER — Ambulatory Visit (HOSPITAL_COMMUNITY): Payer: BLUE CROSS/BLUE SHIELD | Attending: Cardiovascular Disease

## 2020-10-15 DIAGNOSIS — R55 Syncope and collapse: Secondary | ICD-10-CM | POA: Diagnosis not present

## 2020-10-15 LAB — ECHOCARDIOGRAM COMPLETE
Area-P 1/2: 2.49 cm2
S' Lateral: 2.7 cm

## 2020-10-20 ENCOUNTER — Telehealth: Payer: Self-pay | Admitting: Neurology

## 2020-10-20 ENCOUNTER — Ambulatory Visit: Payer: BLUE CROSS/BLUE SHIELD | Admitting: Neurology

## 2020-10-20 DIAGNOSIS — R4020 Unspecified coma: Secondary | ICD-10-CM

## 2020-10-20 DIAGNOSIS — R55 Syncope and collapse: Secondary | ICD-10-CM | POA: Diagnosis not present

## 2020-10-20 DIAGNOSIS — R569 Unspecified convulsions: Secondary | ICD-10-CM

## 2020-10-20 NOTE — Telephone Encounter (Signed)
Jerome Hall Patient came in to schedule MRI went over all details of MRI and Cost . Patient is requesting that he had a EEG with [name] today . Could insurance be called back in a few weeks to see if anymore can come off the cost . Patient is scheduled for MRI 11/10/2020 .

## 2020-10-21 NOTE — Telephone Encounter (Signed)
Noted, thank you

## 2020-10-27 ENCOUNTER — Telehealth: Payer: Self-pay | Admitting: *Deleted

## 2020-10-27 NOTE — Progress Notes (Signed)
Kindly inform the patient that EEG study was normal

## 2020-10-27 NOTE — Telephone Encounter (Signed)
I spoke to the patient and provided him with the results.  

## 2020-10-27 NOTE — Telephone Encounter (Signed)
-----   Message from Micki Riley, MD sent at 10/27/2020  9:28 AM EST ----- Kindly inform the patient that EEG study was normal

## 2020-11-09 NOTE — Telephone Encounter (Signed)
MR Brain w/wo contrast Dr. Gunnar Fusi Berkley Harvey: [id number] (exp. 11/09/20 to 12/08/20). Patient is r/s for 11/24/20.

## 2020-11-10 ENCOUNTER — Other Ambulatory Visit: Payer: BLUE CROSS/BLUE SHIELD

## 2020-11-12 ENCOUNTER — Ambulatory Visit: Payer: Self-pay | Admitting: Internal Medicine

## 2020-11-16 ENCOUNTER — Other Ambulatory Visit (HOSPITAL_COMMUNITY): Payer: Self-pay

## 2020-11-24 ENCOUNTER — Other Ambulatory Visit: Payer: Self-pay

## 2020-11-24 ENCOUNTER — Ambulatory Visit: Payer: BLUE CROSS/BLUE SHIELD

## 2020-11-24 DIAGNOSIS — R569 Unspecified convulsions: Secondary | ICD-10-CM | POA: Diagnosis not present

## 2020-11-24 DIAGNOSIS — R55 Syncope and collapse: Secondary | ICD-10-CM

## 2020-11-24 DIAGNOSIS — R4020 Unspecified coma: Secondary | ICD-10-CM

## 2020-11-24 MED ORDER — GADOBENATE DIMEGLUMINE 529 MG/ML IV SOLN
20.0000 mL | Freq: Once | INTRAVENOUS | Status: AC | PRN
Start: 2020-11-24 — End: 2020-11-24
  Administered 2020-11-24: 20 mL via INTRAVENOUS

## 2020-12-07 ENCOUNTER — Encounter: Payer: Self-pay | Admitting: *Deleted

## 2020-12-07 ENCOUNTER — Ambulatory Visit: Payer: Self-pay | Admitting: Neurology

## 2020-12-07 NOTE — Progress Notes (Signed)
Kindly inform the patient that MRI scan study of the brain was normal.  No worrisome findings

## 2020-12-07 NOTE — Progress Notes (Signed)
Kindly inform the patient that heart monitoring study did not show any worrisome findings like heart blocks or arrhythmias.  Nothing to worry about

## 2020-12-15 ENCOUNTER — Ambulatory Visit: Payer: BLUE CROSS/BLUE SHIELD | Admitting: Internal Medicine

## 2020-12-15 ENCOUNTER — Encounter: Payer: Self-pay | Admitting: Internal Medicine

## 2020-12-15 ENCOUNTER — Other Ambulatory Visit: Payer: Self-pay

## 2020-12-15 VITALS — BP 122/82 | HR 77 | Ht 76.0 in | Wt 213.4 lb

## 2020-12-15 DIAGNOSIS — I712 Thoracic aortic aneurysm, without rupture, unspecified: Secondary | ICD-10-CM

## 2020-12-15 DIAGNOSIS — I10 Essential (primary) hypertension: Secondary | ICD-10-CM

## 2020-12-15 DIAGNOSIS — E7800 Pure hypercholesterolemia, unspecified: Secondary | ICD-10-CM

## 2020-12-15 DIAGNOSIS — R55 Syncope and collapse: Secondary | ICD-10-CM | POA: Diagnosis not present

## 2020-12-15 NOTE — Progress Notes (Signed)
Cardiology Office Note:    Date:  12/15/2020   ID:  Jerome Hall, DOB 08-01-1966, MRN [id number]  PCP:  Kirby Funk, MD  Cardiologist:  No primary care provider on file.  Electrophysiologist:  None   Referring MD: Kirby Funk, MD   Chief Complaint/Reason for Referral: LOC  History of Present Illness:    Jerome Hall is a 55 y.o. male with a history of depression and HTN, and no other significant past cardiovascular history who presents with follow up of an episode of syncope vs seizure.   Seen neurology, MRI and EEG normal.   Reviewed cardiac testing, including cardiac monitor with no worrisome findings and echocardiogram. Spent bulk of visit discussing borderline aorta dilation which may be upper limit of normal given his age and BSA (upper limit of normal around 42 mm at sinuses). He has done weight lifting for over 30 years, and is hesitant to stop. We discussed conservative strategies for avoiding further aorta dilation per his request. This includes no heavy lifting or exercises that acutely raise afterload in the arms (described in detail in plain terms for patient). Encouraged excellent control of BP. Pathophysiology, natural history, and indications for repair also reviewed in detail.   Past Medical History:  Diagnosis Date  . Depression   . Hypertension     No past surgical history on file.  Current Medications: Current Meds  Medication Sig  . ALPRAZolam (XANAX) 0.5 MG tablet 1 tablet  . escitalopram (LEXAPRO) 20 MG tablet TAKE 1 TABLET BY MOUTH EVERYDAY AT BEDTIME  . telmisartan (MICARDIS) 40 MG tablet Take 40 mg by mouth daily.     Allergies:   Patient has no known allergies.   Social History   Tobacco Use  . Smoking status: Never Smoker  . Smokeless tobacco: Never Used  Substance Use Topics  . Alcohol use: Yes    Comment: occ  . Drug use: Never     Family History: The patient's family history is not on file.  ROS:   Please see the history  of present illness.    All other systems reviewed and are negative.  EKGs/Labs/Other Studies Reviewed:    The following studies were reviewed today: Recent Labs: 10/03/2020: ALT 58; BUN 13; Creatinine, Ser 1.42; Hemoglobin 14.8; Magnesium 1.9; Platelets 229; Potassium 3.8; Sodium 136  Recent Lipid Panel No results found for: CHOL, TRIG, HDL, CHOLHDL, VLDL, LDLCALC, LDLDIRECT  Physical Exam:    VS:  BP 122/82   Pulse 77   Ht 6\' 4"  (1.93 m)   Wt 213 lb 6.4 oz (96.8 kg)   SpO2 97%   BMI 25.98 kg/m     Wt Readings from Last 5 Encounters:  12/15/20 213 lb 6.4 oz (96.8 kg)  10/14/20 210 lb 9.6 oz (95.5 kg)  10/05/20 202 lb (91.6 kg)  10/03/20 208 lb (94.3 kg)    Constitutional: No acute distress Eyes: sclera non-icteric, normal conjunctiva and lids ENMT: normal dentition, moist mucous membranes Cardiovascular: regular rhythm, normal rate, no murmurs. S1 and S2 normal. Radial pulses normal bilaterally. No jugular venous distention.  Respiratory: clear to auscultation bilaterally GI : normal bowel sounds, soft and nontender. No distention.   MSK: extremities warm, well perfused. No edema.  NEURO: grossly nonfocal exam, moves all extremities. PSYCH: alert and oriented x 3, normal mood and affect.   ASSESSMENT:    1. Syncope and collapse   2. Essential hypertension   3. Pure hypercholesterolemia   4. Thoracic aortic aneurysm  without rupture (HCC)    PLAN:    Syncope and collapse - no cardiovascular cause identified. If related to hypovolemia and increased vagal tone, encourage hydration and compression garments. No symptomatic recurrence.   Essential hypertension - continue telmisartan. BP spuriously low on initial reading. Normal on repeat. BP goal is 120/80 given young age and overall good health.   Thoracic aortic aneurysm without rupture (HCC) - Plan: ECHOCARDIOGRAM COMPLETE - reviewed in detail as per HPI. For aorta surveillance, recommend echo in 1 year at time of  follow up. Given BSA, aorta measurement of 40 mm may be within normal range. Discouraged heavy lifting and hypertension.   Total time of encounter: 45 minutes total time of encounter, including 30 minutes spent in face-to-face patient care on the date of this encounter. This time includes coordination of care and counseling regarding above mentioned problem list. Remainder of non-face-to-face time involved reviewing chart documents/testing relevant to the patient encounter and documentation in the medical record. I have independently reviewed documentation from referring provider.   [name], MD, [facility]  [facility]    Medication Adjustments/Labs and Tests Ordered: Current medicines are reviewed at length with the patient today.  Concerns regarding medicines are outlined above.   Orders Placed This Encounter  Procedures  . ECHOCARDIOGRAM COMPLETE    Patient Instructions  Medication Instructions:  No Changes In Medications at this time.  *If you need a refill on your cardiac medications before your next appointment, please call your pharmacy*  Testing/Procedures: Your physician has requested that you have an echocardiogram in 1 year. Echocardiography is a painless test that uses sound waves to create images of your heart. It provides your doctor with information about the size and shape of your heart and how well your heart's chambers and valves are working. You may receive an ultrasound enhancing agent through an IV if needed to better visualize your heart during the echo.This procedure takes approximately one hour. There are no restrictions for this procedure. This will take place at the [address], [address].   Follow-Up: At [facility], you and your health needs are our priority.  As part of our continuing mission to provide you with exceptional heart care, we have created designated Provider Care Teams.  These Care Teams include your primary Cardiologist  (physician) and Advanced Practice Providers (APPs -  Physician Assistants and Nurse Practitioners) who all work together to provide you with the care you need, when you need it.  Your next appointment:   1 year(s)  The format for your next appointment:   In Person  Provider:   [name], MD

## 2020-12-15 NOTE — Patient Instructions (Signed)
Medication Instructions:  No Changes In Medications at this time.  *If you need a refill on your cardiac medications before your next appointment, please call your pharmacy*  Testing/Procedures: Your physician has requested that you have an echocardiogram in 1 year. Echocardiography is a painless test that uses sound waves to create images of your heart. It provides your doctor with information about the size and shape of your heart and how well your heart's chambers and valves are working. You may receive an ultrasound enhancing agent through an IV if needed to better visualize your heart during the echo.This procedure takes approximately one hour. There are no restrictions for this procedure. This will take place at the [address]   Follow-Up: At [facility], you and your health needs are our priority.  As part of our continuing mission to provide you with exceptional heart care, we have created designated Provider Care Teams.  These Care Teams include your primary Cardiologist (physician) and Advanced Practice Providers (APPs -  Physician Assistants and Nurse Practitioners) who all work together to provide you with the care you need, when you need it.  Your next appointment:   1 year(s)  The format for your next appointment:   In Person  Provider:   [name], MD

## 2020-12-22 ENCOUNTER — Ambulatory Visit: Payer: BLUE CROSS/BLUE SHIELD | Admitting: Neurology

## 2020-12-22 ENCOUNTER — Encounter: Payer: Self-pay | Admitting: Neurology

## 2020-12-22 ENCOUNTER — Other Ambulatory Visit: Payer: Self-pay

## 2020-12-22 VITALS — BP 119/77 | HR 89 | Ht 76.0 in | Wt 209.2 lb

## 2020-12-22 DIAGNOSIS — R4020 Unspecified coma: Secondary | ICD-10-CM | POA: Diagnosis not present

## 2020-12-22 DIAGNOSIS — R569 Unspecified convulsions: Secondary | ICD-10-CM | POA: Diagnosis not present

## 2020-12-22 MED ORDER — DIVALPROEX SODIUM ER 500 MG PO TB24: 500.0000 mg | ORAL_TABLET | Freq: Every day | ORAL | 3 refills | Status: DC

## 2020-12-22 NOTE — Progress Notes (Signed)
[facility] [address] [place]. [place] [address] [phone]       OFFICE FOLLOW UP VISIT NOTE  [name] Date of Birth:  Sep 08, 1966 Medical Record Number:  [id number]   Referring MD: Pricilla Loveless  Reason for Referral: Episode of loss of consciousness HPI: Initial visit 10/14/2020 Jerome Hall is a 55 year old Caucasian male seen today for initial office consultation visit for episode of loss of consciousness.  History is obtained from the patient as well as his wife is present at the bedside and review of electronic medical records and I personally reviewed available pertinent imaging films in PACS.  He has past medical history of hypertension and depression.  He presented to [facility], ER on 10/03/2020 with an episode of loss of consciousness and involuntary jerking.  Patient states he remember having driven from [facility] to [place] which is an hour and a half.  When he parked his car he noticed that it was dirty and he states he went down to turn the house to clean the car when he started feeling that his mind was racing and the next thing he remembers is waking up in the ambulance on the right to the hospital.  His son was inside came out and noticed that he was lying on the ground foaming at his mouth and trembling in his extremities.  His wife subsequently arrived and noticed mouth frothing and the tremoring stopped.  Patient remained unresponsive with eyes open with a glazed look on his face and staring into the distance.  He was slow to respond.  After EMS arrived he is appeared confused and disoriented.  Patient's did not have memory of the event till he reached the emergency room and he can recall details from that point onwards but he cannot recall any details for the preceding hour and a half or so.  We will complain of mild headache later.  In the ER he had CT scan of the brain which was unremarkable and CT scan of cervical spine showed only  minor disc degenerative changes without any fracture.  Lab work was significant for slightly elevated white count of 13.8 and creatinine of 1.42.  Patient admitted that he had been sleep deprived and has been under significant stress as he had 3 criminal trials coming up in which she has to go in front of a judge.  He also had not been eating and drinking well.  Patient states she has had no previous episodes of passing out but he is haddock couple of other episodes where he had somewhat similar feeling but did not pass out and was able to avoid passing out (stopping what he was doing and sitting down.  These occurred twice on the basketball court once in 2012 and another few months later.  He sat down on both occasions and did not lose consciousness and did not notice that he was slightly disoriented and could not focus still is feeling past.  Did not seek medical evaluation at that time.  Patient has since seen cardiologist Dr. [name] was ordered a 30-day heart monitor but felt this episode was unlikely to be cardiac related since he has no significant cardiac history.  Patient denies any prior history of palpitations, chest pain with significant cardiac issues.  There is no prior history of seizures, significant head injury with loss of consciousness family history of epilepsy.  He has no prior history of strokes, TIAs, migraines or significant neurological problems.  He does have history of depression and takes Xanax and Wellbutrin and admits that he had not been taking Xanax on a predictable and regular basis and takes it when he needs it and of late and not taking it.  He has since also talked to Dr. Valentina Lucks his primary physician about changing Wellbutrin to alternative depressant medication Update 12/22/2020 : He returns for follow-up after last visit 2 months ago.  He states he is doing well and has had no further episodes of loss of consciousness or any seizure-like activity.  He underwent MRI scan  of the brain on 11/25/2020 which was normal.  EEG done on 10/25/2020 was also normal.  Echocardiogram on 10/15/2020 showed normal ejection fraction without any significant abnormality except borderline aortic root dilatation which may be upper limit of normal given his age and body surface area.  30-day outpatient cardiac monitoring study did not show evidence of any significant arrhythmias or heart blocks.  Patient states is done well is had no recurrent episodes.  He has no complaints. ROS:   14 system review of systems is positive for loss of consciousness, memory loss,  all other systems negative PMH:  Past Medical History:  Diagnosis Date  . Depression   . Hypertension     Social History:  Social History   Socioeconomic History  . Marital status: Married    Spouse name: [name]  . Number of children: Not on file  . Years of education: Not on file  . Highest education level: Not on file  Occupational History  . Occupation: full time 10/14/20  Tobacco Use  . Smoking status: Never Smoker  . Smokeless tobacco: Never Used  Substance and Sexual Activity  . Alcohol use: Yes    Comment: occ  . Drug use: Never  . Sexual activity: Never    Partners: Female  Other Topics Concern  . Not on file  Social History Narrative   Lives with wife and son   Right Handed   Drinks 4-6 cups caffeine   Social Determinants of Health   Financial Resource Strain: Not on file  Food Insecurity: Not on file  Transportation Needs: Not on file  Physical Activity: Not on file  Stress: Not on file  Social Connections: Not on file  Intimate Partner Violence: Not on file    Medications:   Current Outpatient Medications on File Prior to Visit  Medication Sig Dispense Refill  . ALPRAZolam (XANAX) 0.5 MG tablet 1 tablet    . escitalopram (LEXAPRO) 20 MG tablet TAKE 1 TABLET BY MOUTH EVERYDAY AT BEDTIME 90 tablet 0  . telmisartan (MICARDIS) 40 MG tablet Take 40 mg by mouth daily.     No current  facility-administered medications on file prior to visit.    Allergies:  No Known Allergies  Physical Exam General: well developed, well nourished middle-aged Caucasian male, seated, in no evident distress Head: head normocephalic and atraumatic.   Neck: supple with no carotid or supraclavicular bruits Cardiovascular: regular rate and rhythm, no murmurs Musculoskeletal: no deformity Skin:  no rash/petichiae Vascular:  Normal pulses all extremities  Neurologic Exam Mental Status: Awake and fully alert. Oriented to place and time. Recent and remote memory intact. Attention span, concentration and fund of knowledge appropriate. Mood and affect appropriate.   Cranial Nerves: Fundoscopic exam not done. Pupils equal, briskly reactive to light. Extraocular movements full without nystagmus. Visual fields full to confrontation. Hearing intact. Facial sensation intact. Face, tongue, palate moves normally and symmetrically.  Motor:  Normal bulk and tone. Normal strength in all tested extremity muscles. Sensory.: intact to touch , pinprick , position and vibratory sensation.  Coordination: Rapid alternating movements normal in all extremities. Finger-to-nose and heel-to-shin performed accurately bilaterally. Gait and Station: Arises from chair without difficulty. Stance is normal. Gait demonstrates normal stride length and balance . Able to heel, toe and tandem walk without difficulty.  Reflexes: 1+ and symmetric. Toes downgoing.       ASSESSMENT: 55 year old Caucasian male with episode of brief loss of consciousness followed by memory loss and tiredness likely seizure with postictal state .  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  We discussed risk of seizure recurrence and risk benefit of  starting anticonvulsants at the present time and answered his questions.  I discussed the results of MRI scan of the brain, EEG, echocardiogram and cardiac monitoring studies all of which were unremarkable.  I recommend   a trial of Depakote ER 500 mg daily for seizure prevention and we discussed possible side effects and advised him to call me if needed.  He asked me about heavy lifting weights in the gym and told him I showed no relationship between this and seizure risk.  He was also advised not to drive for 6 months as per [facility] law since his seizure-like episode he will return for follow-up in 3 months or call earlier if necessary. Greater than 50% time during this 25-minute visit was spent on counseling and coordination of care about his episode of loss of consciousness and discussion about differential diagnosis, seizure evaluation treatment and answering questions. Delia Heady, MD Note: This document was prepared with digital dictation and possible smart phrase technology. Any transcriptional errors that result from this process are unintentional.

## 2020-12-22 NOTE — Patient Instructions (Signed)
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  We discussed risk of seizure recurrence and risk benefit of starting anticonvulsants at the present time and answered his questions.  I discussed the results of MRI scan of the brain, EEG, echocardiogram and cardiac monitoring studies all of which were unremarkable.  I recommend   a trial of Depakote ER 500 mg daily for seizure prevention and we discussed possible side effects and advised him to call me if needed.  He was also advised not to drive for 6 months as per [facility] law since his seizure-like episode he will return for follow-up in 3 months or call earlier if necessary.

## 2021-01-21 IMAGING — CT CT HEAD W/O CM
4 series · 15 of 47 positions shown, 17 images · non-contrast
Comparison: None.

CLINICAL DATA: Witness seizure today.  Facial trauma.

EXAM:
CT HEAD WITHOUT CONTRAST
CT CERVICAL SPINE WITHOUT CONTRAST
TECHNIQUE: Multidetector CT imaging of the head and cervical spine was
performed following the standard protocol without intravenous
contrast. Multiplanar CT image reconstructions of the cervical spine
were also generated.

[Series 3: head wo · axial · 0.46mm/px · z∈[-166,-31]mm · 7 of 37 slices shown, 9 images]
[im 5/37  brain]
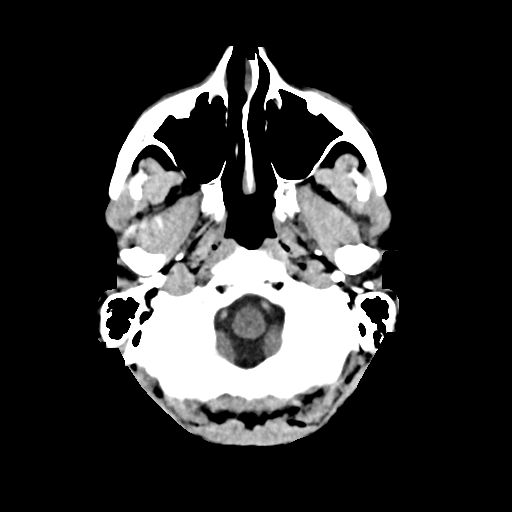
[im 5/37  bone]
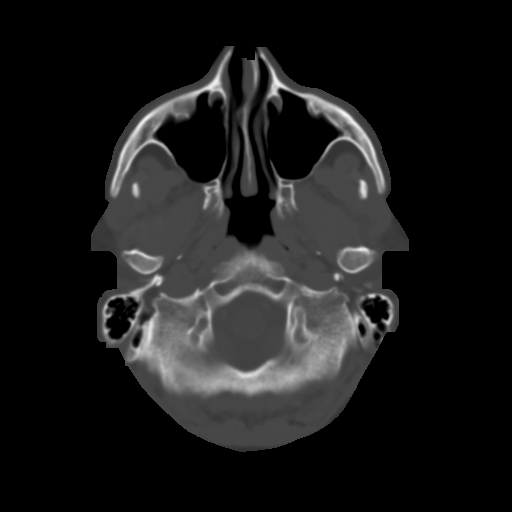
[im 10/37  brain]
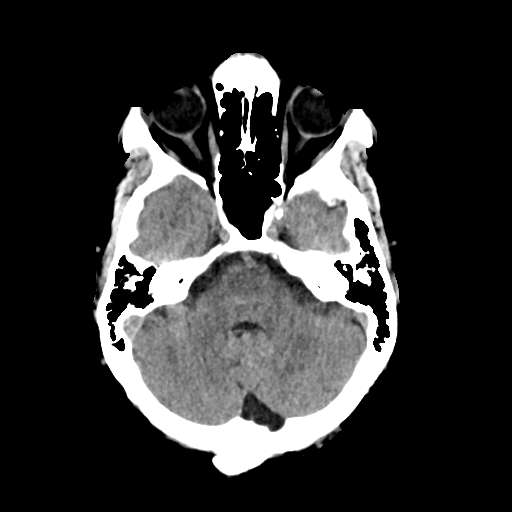
[im 14/37  brain]
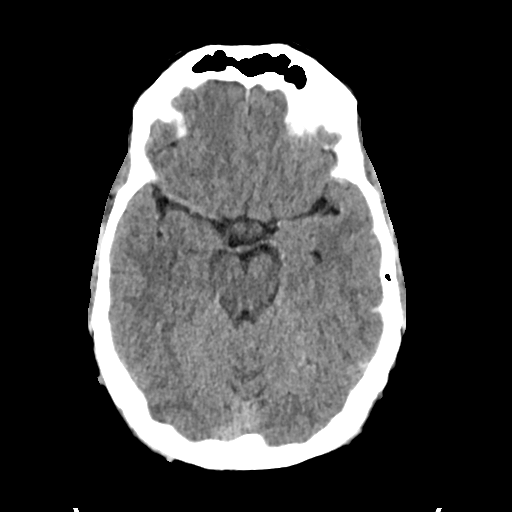
[im 19/37  brain]
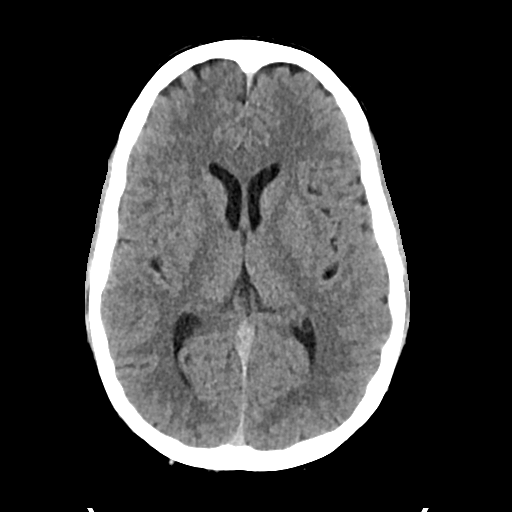
[im 23/37  brain]
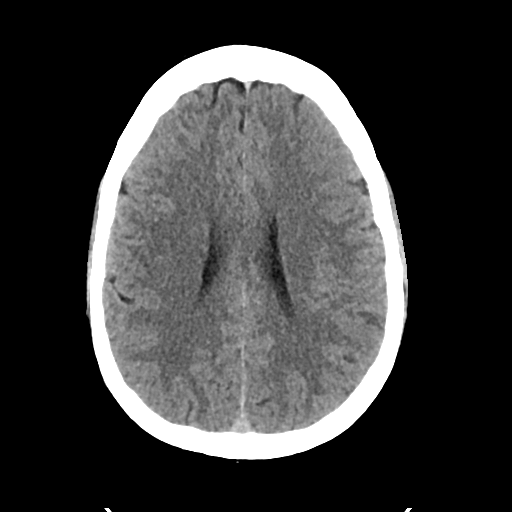
[im 23/37  bone]
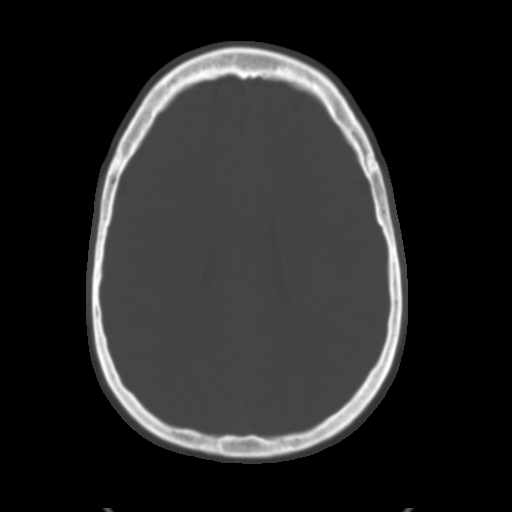
[im 28/37  brain]
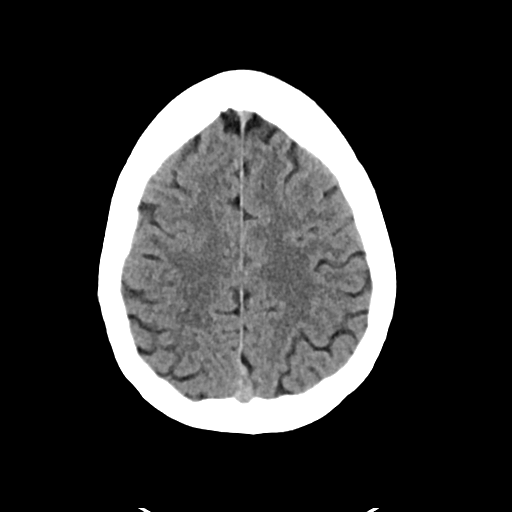
[im 32/37  brain]
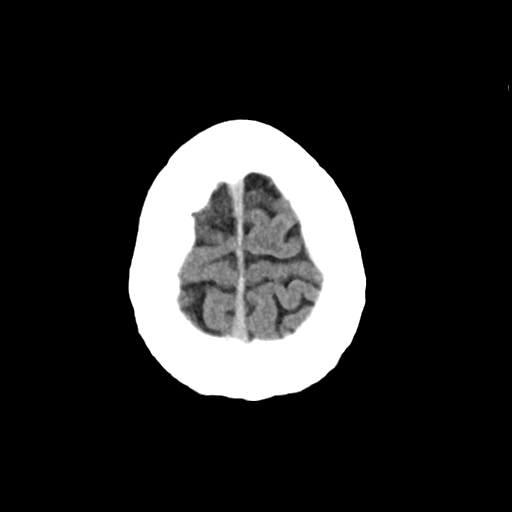

[Series 5: head bone · axial · 0.46mm/px · z∈[-168,-150]mm · 2 of 91 slices shown]
[im 10/91  bone]
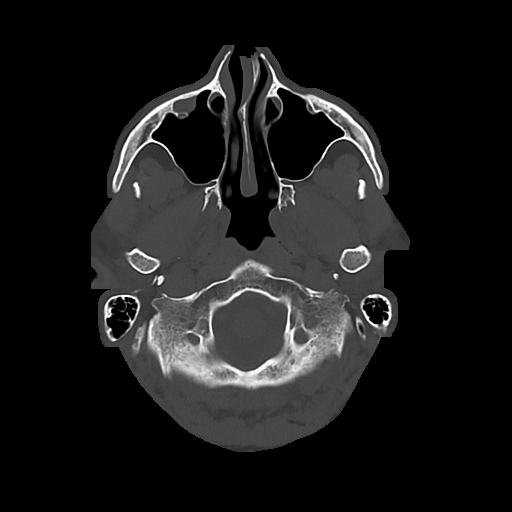
[im 19/91  bone]
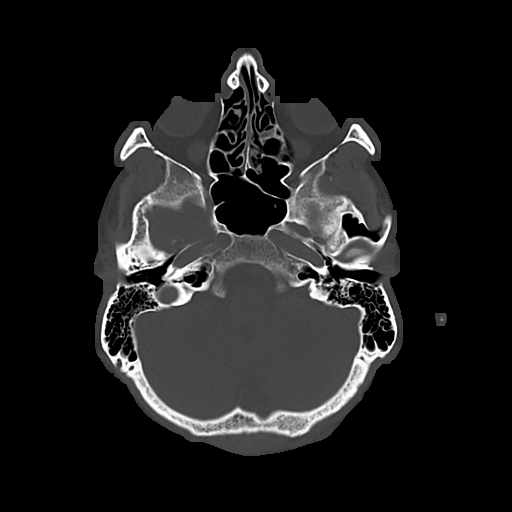

[Series 6: cor soft · coronal · 0.35mm/px · 3 of 80 slices shown]
[im 27/80  brain]
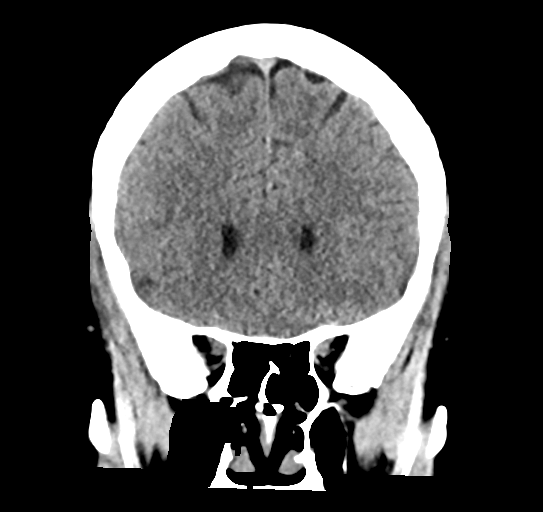
[im 36/80  brain]
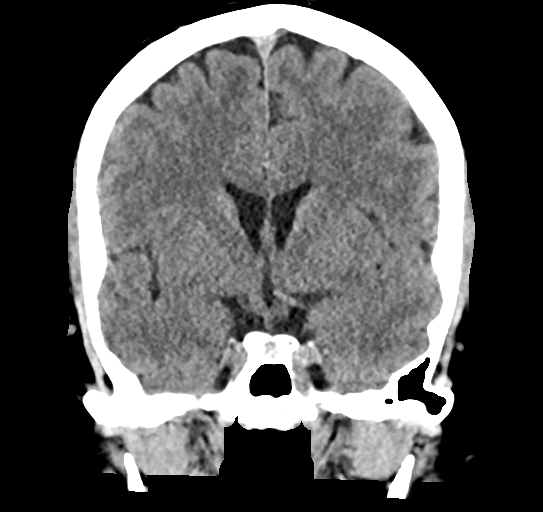
[im 44/80  brain]
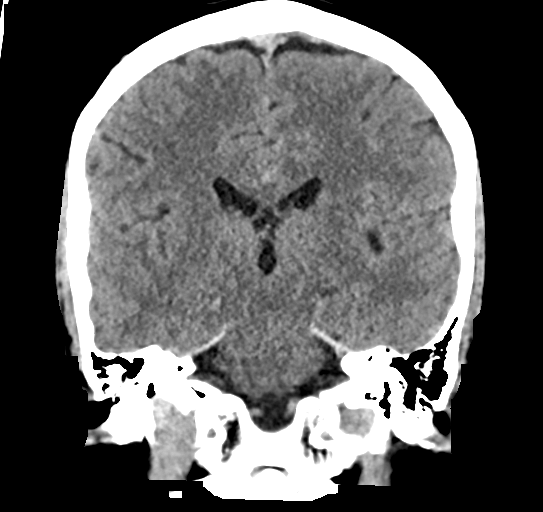

[Series 7: sag soft · sagittal · 0.35mm/px · 3 of 64 slices shown]
[im 22/64  brain]
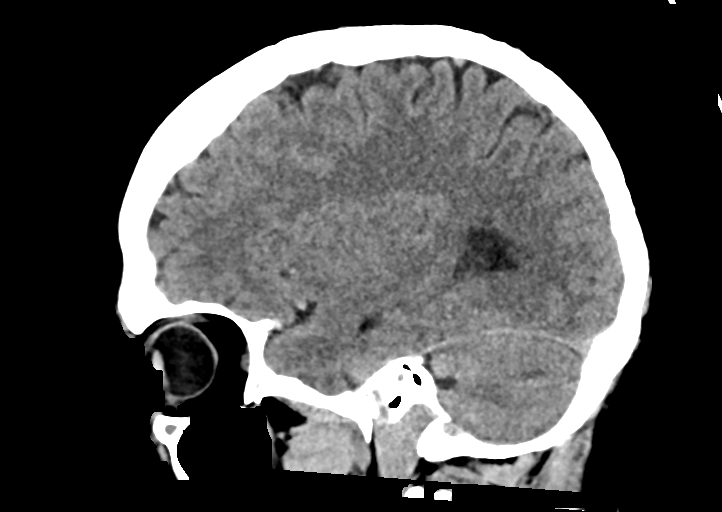
[im 32/64  brain]
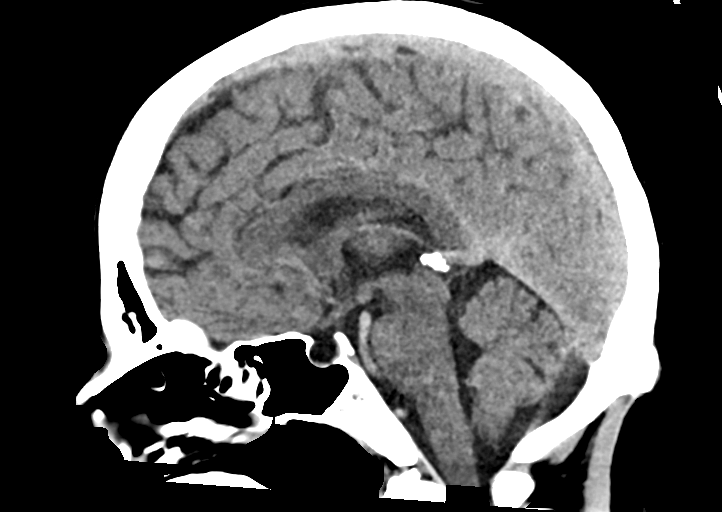
[im 43/64  brain]
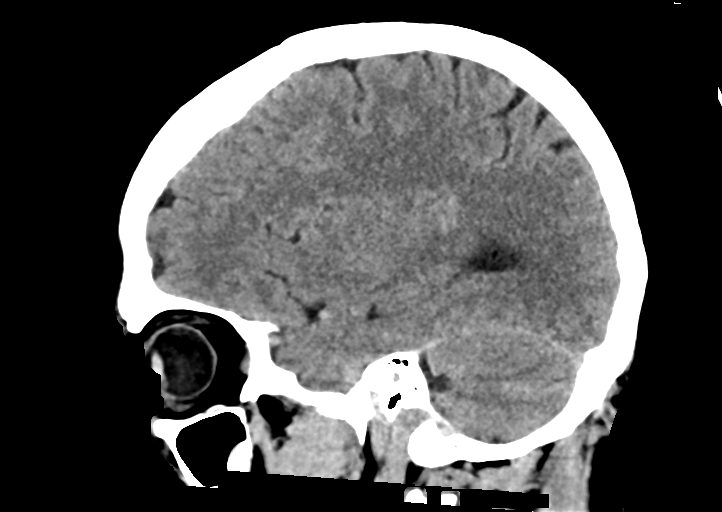

[15 of 47 positions shown; findings below may reference images not displayed]

FINDINGS: CT HEAD FINDINGS

Brain: No evidence of acute infarction, hemorrhage, hydrocephalus,
extra-axial collection or mass lesion/mass effect.

Vascular: No hyperdense vessel or unexpected calcification.

Skull: Calvarium appears intact. No acute depressed skull fractures.

Sinuses/Orbits: Paranasal sinuses and mastoid air cells are clear.

Other: None.

CT CERVICAL SPINE FINDINGS

Alignment: Reversal of the usual cervical lordosis without anterior
subluxation. Normal alignment of the posterior elements. Appearance
is likely positional but muscle spasm could also have this
appearance.

Skull base and vertebrae: Skull base appears intact. No vertebral
compression deformities. No focal bone lesion or bone destruction.
Bone cortex appears intact.

Soft tissues and spinal canal: No prevertebral soft tissue swelling.
No abnormal paraspinal soft tissue mass or infiltration.

Disc levels: Mild disc space narrowing and endplate hypertrophic
changes demonstrated at C3-4, C4-5, and C5-6 levels. Mild
degenerative changes in the facet joints.

Upper chest: Visualized lung apices are clear.

Other: None.
IMPRESSION: 1. No acute intracranial abnormalities.
2. Nonspecific reversal of the usual cervical lordosis. Mild
degenerative changes in the cervical spine. No acute displaced
fractures identified.

## 2021-01-21 IMAGING — DX DG CHEST 1V PORT
1 series · 2 of 2 positions shown · non-contrast
Comparison: None.

CLINICAL DATA: Syncope versus seizure

EXAM:
PORTABLE CHEST 1 VIEW

[Series 1: chest ap · 0.14mm/px · 2 of 2 slices shown]
[im 1/2]
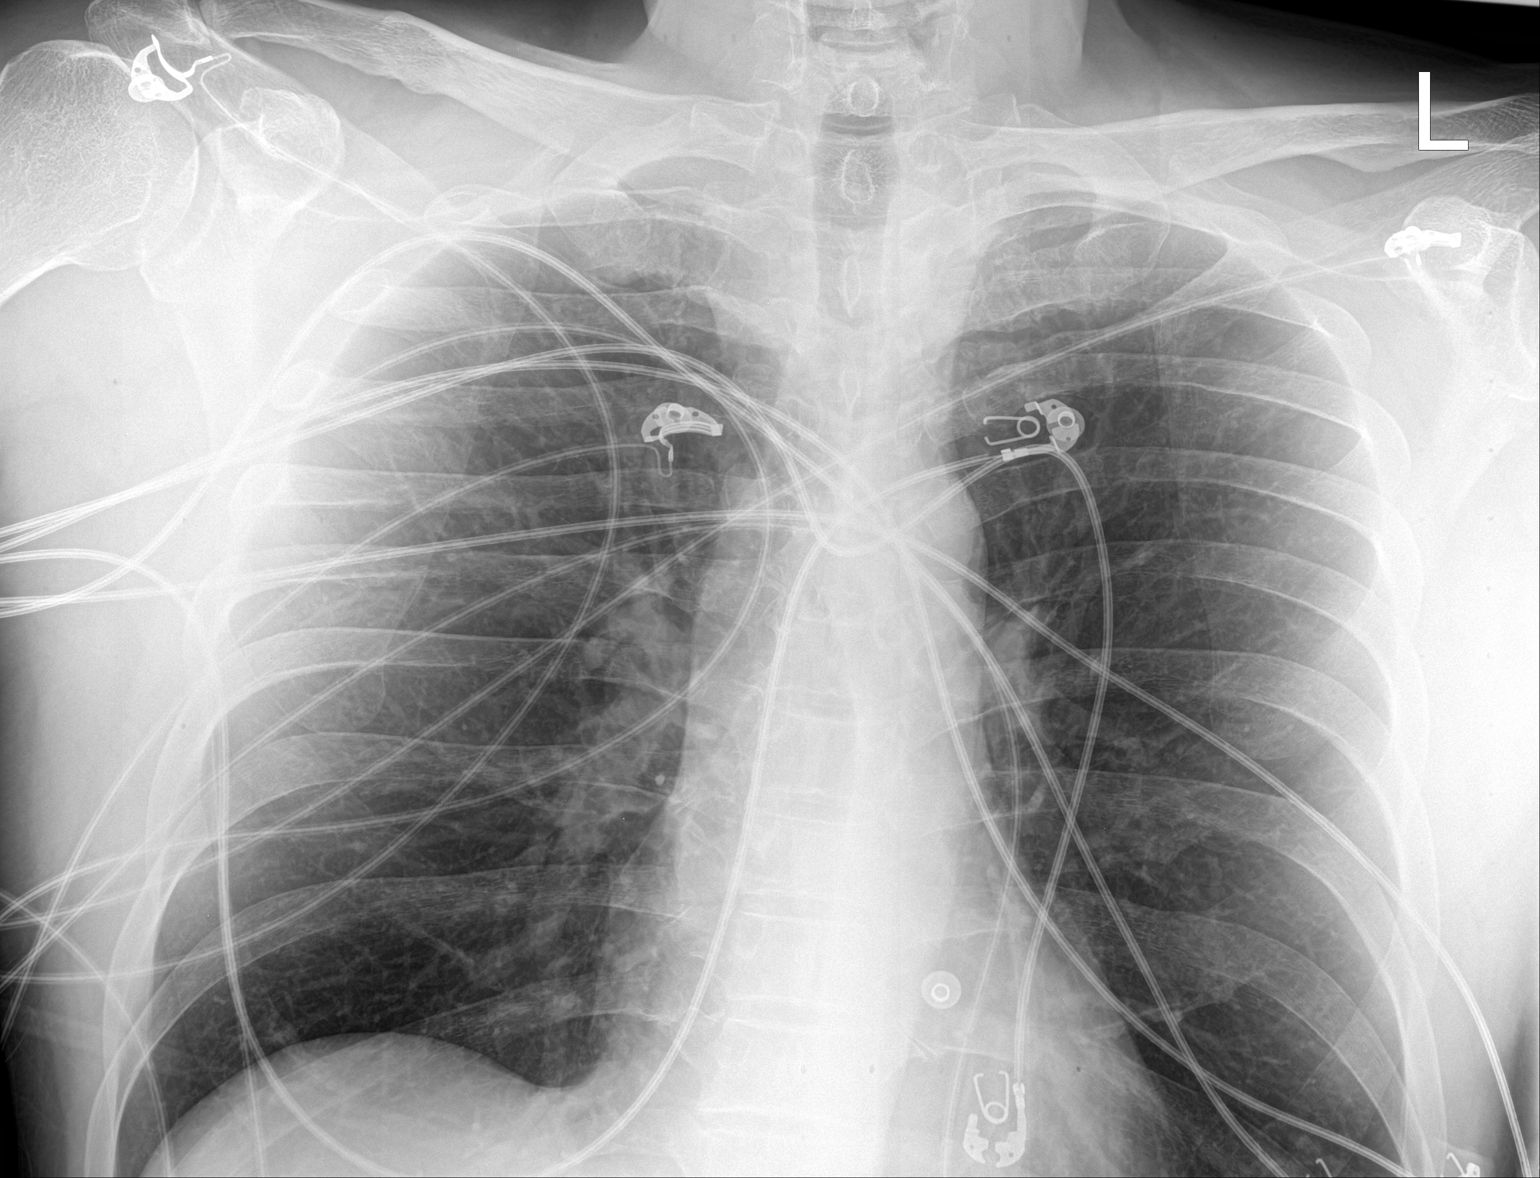
[im 2/2]
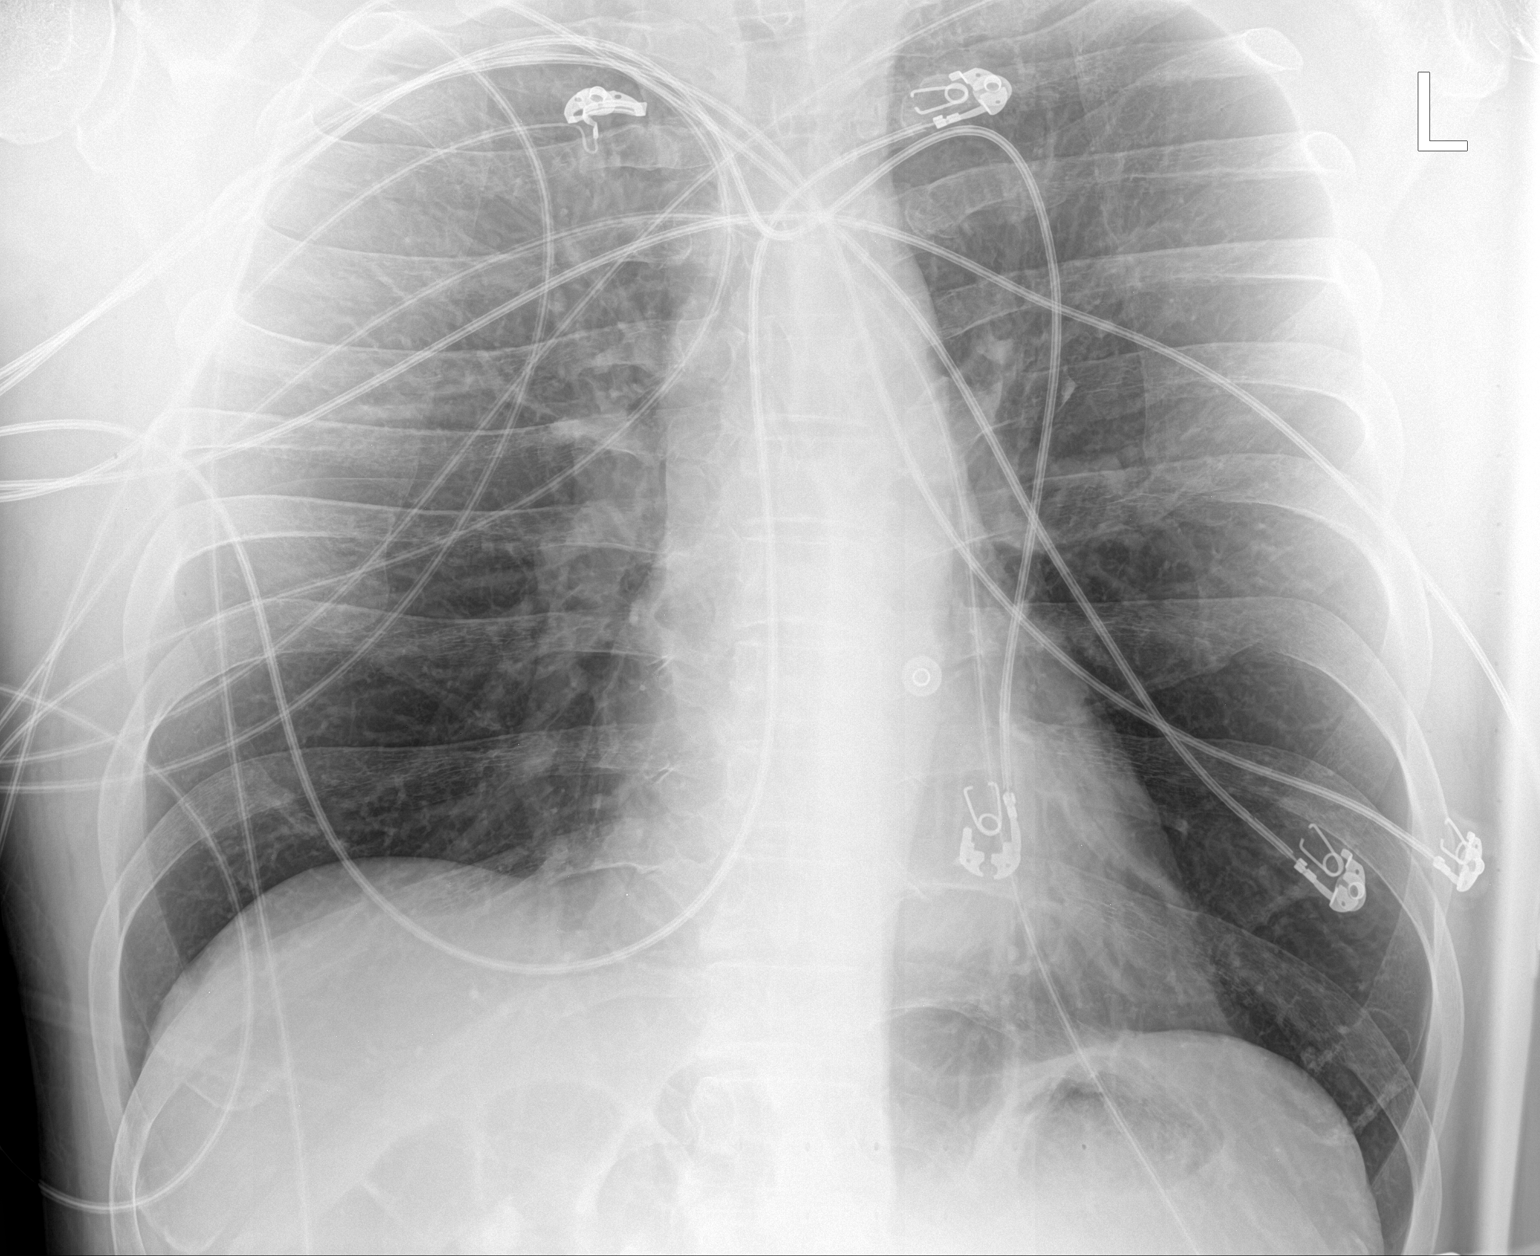

[2 of 2 positions shown; findings below may reference images not displayed]

FINDINGS: Hyperinflation. Heart size and pulmonary vascularity are normal.
Lungs are clear. No pleural effusions. No pneumothorax. Mediastinal
contours appear intact.
IMPRESSION: No active disease.

## 2021-03-14 ENCOUNTER — Other Ambulatory Visit: Payer: Self-pay | Admitting: Neurology

## 2021-03-15 NOTE — Telephone Encounter (Signed)
LVM on patient's number regarding depakote refill.    I was inquiring to how he is doing on the depakote?  Is he having any problems taking it?  Also sent Mychart message.

## 2021-04-12 ENCOUNTER — Ambulatory Visit: Payer: 59 | Admitting: Neurology

## 2021-04-12 VITALS — BP 117/70 | HR 76 | Ht 76.0 in | Wt 211.0 lb

## 2021-04-12 DIAGNOSIS — G40909 Epilepsy, unspecified, not intractable, without status epilepticus: Secondary | ICD-10-CM | POA: Diagnosis not present

## 2021-04-12 MED ORDER — DIVALPROEX SODIUM ER 500 MG PO TB24: 500.0000 mg | ORAL_TABLET | Freq: Every day | ORAL | 3 refills | Status: DC

## 2021-04-12 NOTE — Patient Instructions (Signed)
I had a brief discussion with the patient regarding his solitary episode of possible seizure and he seems to be doing well on the current medication regimen on Depakote ER 500 mg daily which he is tolerating well without significant side effects.  He was advised to avoid seizure provoking stimuli like sleep deprivation, irregular eating and sleeping habits and medication noncompliance.  Patient was neurologically cleared to resume driving as it has been more than 6 months since the episode now.  He was given a refill for Depakote for 1 year and advised to return back as necessary.

## 2021-04-12 NOTE — Progress Notes (Signed)
[facility] [address] [place]. [place] [address] [phone]       OFFICE FOLLOW UP VISIT NOTE  Jerome Hall Date of Birth:  Sep 08, 1966 Medical Record Number:  [id number]   Referring MD: Pricilla Loveless  Reason for Referral: Episode of loss of consciousness HPI: Initial visit 10/14/2020 Jerome Hall is a 55 year old Caucasian male seen today for initial office consultation visit for episode of loss of consciousness.  History is obtained from the patient as well as his wife is present at the bedside and review of electronic medical records and I personally reviewed available pertinent imaging films in PACS.  He has past medical history of hypertension and depression.  He presented to [facility] ER on 10/03/2020 with an episode of loss of consciousness and involuntary jerking.  Patient states he remember having driven from [facility] to [place] which is an hour and a half.  When he parked his car he noticed that it was dirty and he states he went down to turn the house to clean the car when he started feeling that his mind was racing and the next thing he remembers is waking up in the ambulance on the right to the hospital.  His son was inside came out and noticed that he was lying on the ground foaming at his mouth and trembling in his extremities.  His wife subsequently arrived and noticed mouth frothing and the tremoring stopped.  Patient remained unresponsive with eyes open with a glazed look on his face and staring into the distance.  He was slow to respond.  After EMS arrived he is appeared confused and disoriented.  Patient's did not have memory of the event till he reached the emergency room and he can recall details from that point onwards but he cannot recall any details for the preceding hour and a half or so.  We will complain of mild headache later.  In the ER he had CT scan of the brain which was unremarkable and CT scan of cervical spine showed only  minor disc degenerative changes without any fracture.  Lab work was significant for slightly elevated white count of 13.8 and creatinine of 1.42.  Patient admitted that he had been sleep deprived and has been under significant stress as he had 3 criminal trials coming up in which she has to go in front of a judge.  He also had not been eating and drinking well.  Patient states she has had no previous episodes of passing out but he is haddock couple of other episodes where he had somewhat similar feeling but did not pass out and was able to avoid passing out (stopping what he was doing and sitting down.  These occurred twice on the basketball court once in 2012 and another few months later.  He sat down on both occasions and did not lose consciousness and did not notice that he was slightly disoriented and could not focus still is feeling past.  Did not seek medical evaluation at that time.  Patient has since seen cardiologist Dr. [name] was ordered a 30-day heart monitor but felt this episode was unlikely to be cardiac related since he has no significant cardiac history.  Patient denies any prior history of palpitations, chest pain with significant cardiac issues.  There is no prior history of seizures, significant head injury with loss of consciousness family history of epilepsy.  He has no prior history of strokes, TIAs, migraines or significant neurological problems.  He does have history of depression and takes Xanax and Wellbutrin and admits that he had not been taking Xanax on a predictable and regular basis and takes it when he needs it and of late and not taking it.  He has since also talked to Dr. Valentina Lucks his primary physician about changing Wellbutrin to alternative depressant medication Update 12/22/2020 : He returns for follow-up after last visit 2 months ago.  He states he is doing well and has had no further episodes of loss of consciousness or any seizure-like activity.  He underwent MRI scan  of the brain on 11/25/2020 which was normal.  EEG done on 10/25/2020 was also normal.  Echocardiogram on 10/15/2020 showed normal ejection fraction without any significant abnormality except borderline aortic root dilatation which may be upper limit of normal given his age and body surface area.  30-day outpatient cardiac monitoring study did not show evidence of any significant arrhythmias or heart blocks.  Patient states is done well is had no recurrent episodes.  He has no complaints. Update 04/12/2021: He returns for follow-up after last visit 3 and half months ago.  He is doing well has had no further episodes of brief loss of consciousness or seizure-like activity.  He remains on Depakote ER 500 mg daily which is tolerating well without any side effects.  Patient has not been driving and now its been more than 6 months since episode and would like to resume driving.  He has no new complaints today. ROS:   14 system review of systems is positive for l no complaints today all other systems negative PMH:  Past Medical History:  Diagnosis Date   Depression    Hypertension     Social History:  Social History   Socioeconomic History   Marital status: Married    Spouse name: [name]   Number of children: Not on file   Years of education: Not on file   Highest education level: Not on file  Occupational History   Occupation: full time 10/14/20  Tobacco Use   Smoking status: Never   Smokeless tobacco: Never  Substance and Sexual Activity   Alcohol use: Yes    Comment: occ   Drug use: Never   Sexual activity: Never    Partners: Female  Other Topics Concern   Not on file  Social History Narrative   Lives with wife and son   Right Handed   Drinks 4-6 cups caffeine   Social Determinants of Health   Financial Resource Strain: Not on file  Food Insecurity: Not on file  Transportation Needs: Not on file  Physical Activity: Not on file  Stress: Not on file  Social Connections: Not on file   Intimate Partner Violence: Not on file    Medications:   Current Outpatient Medications on File Prior to Visit  Medication Sig Dispense Refill   ALPRAZolam (XANAX) 0.5 MG tablet 1 tablet     escitalopram (LEXAPRO) 20 MG tablet TAKE 1 TABLET BY MOUTH EVERYDAY AT BEDTIME 90 tablet 0   telmisartan (MICARDIS) 40 MG tablet Take 40 mg by mouth daily.     No current facility-administered medications on file prior to visit.    Allergies:  No Known Allergies  Physical Exam General: well developed, well nourished middle-aged Caucasian male, seated, in no evident distress Head: head normocephalic and atraumatic.   Neck: supple with no carotid or supraclavicular bruits Cardiovascular: regular rate and rhythm, no murmurs Musculoskeletal: no deformity Skin:  no rash/petichiae Vascular:  Normal pulses all extremities  Neurologic Exam Mental Status: Awake and fully alert. Oriented to place and time. Recent and remote memory intact. Attention span, concentration and fund of knowledge appropriate. Mood and affect appropriate.   Cranial Nerves: Fundoscopic exam not done. Pupils equal, briskly reactive to light. Extraocular movements full without nystagmus. Visual fields full to confrontation. Hearing intact. Facial sensation intact. Face, tongue, palate moves normally and symmetrically.  Motor: Normal bulk and tone. Normal strength in all tested extremity muscles. Sensory.: intact to touch , pinprick , position and vibratory sensation.  Coordination: Rapid alternating movements normal in all extremities. Finger-to-nose and heel-to-shin performed accurately bilaterally. Gait and Station: Arises from chair without difficulty. Stance is normal. Gait demonstrates normal stride length and balance .  Tandem walking not checked today.  Reflexes: 1+ and symmetric. Toes downgoing.       ASSESSMENT: 56 year old Caucasian male with episode of brief loss of consciousness followed by memory loss and tiredness  likely seizure with postictal state .  Unremarkable EEG and neuroimaging as well as cardiac monitoring studies.  Patient is doing well on Depakote ER 500 mg daily    PLAN: I had a brief discussion with the patient regarding his solitary episode of possible seizure and he seems to be doing well on the current medication regimen on Depakote ER 500 mg daily which he is tolerating well without significant side effects.  He was advised to avoid seizure provoking stimuli like sleep deprivation, irregular eating and sleeping habits and medication noncompliance.  Patient was neurologically cleared to resume driving as it has been more than 6 months since the episode now.  He was given a refill for Depakote for 1 year and advised to return back as necessary.Greater than 50% time during this 25-minute visit was spent on counseling and coordination of care about his episode of loss of consciousness and discussion about differential diagnosis, seizure evaluation treatment and answering questions. Delia Heady, MD Note: This document was prepared with digital dictation and possible smart phrase technology. Any transcriptional errors that result from this process are unintentional.

## 2021-12-13 ENCOUNTER — Other Ambulatory Visit: Payer: Self-pay

## 2021-12-13 ENCOUNTER — Ambulatory Visit (HOSPITAL_COMMUNITY): Payer: 59 | Attending: Cardiovascular Disease

## 2021-12-13 DIAGNOSIS — I712 Thoracic aortic aneurysm, without rupture, unspecified: Secondary | ICD-10-CM | POA: Diagnosis present

## 2021-12-13 LAB — ECHOCARDIOGRAM COMPLETE
Area-P 1/2: 4.39 cm2
S' Lateral: 3.4 cm

## 2022-02-20 ENCOUNTER — Other Ambulatory Visit: Payer: Self-pay | Admitting: Neurology

## 2022-02-20 NOTE — Telephone Encounter (Signed)
Rx refilled.

## 2022-08-12 DIAGNOSIS — U071 COVID-19: Secondary | ICD-10-CM | POA: Diagnosis not present

## 2022-09-04 DIAGNOSIS — K5730 Diverticulosis of large intestine without perforation or abscess without bleeding: Secondary | ICD-10-CM | POA: Diagnosis not present

## 2022-09-04 DIAGNOSIS — D123 Benign neoplasm of transverse colon: Secondary | ICD-10-CM | POA: Diagnosis not present

## 2022-09-04 DIAGNOSIS — Z1211 Encounter for screening for malignant neoplasm of colon: Secondary | ICD-10-CM | POA: Diagnosis not present

## 2022-09-04 DIAGNOSIS — Z83719 Family history of colon polyps, unspecified: Secondary | ICD-10-CM | POA: Diagnosis not present

## 2022-09-04 DIAGNOSIS — K649 Unspecified hemorrhoids: Secondary | ICD-10-CM | POA: Diagnosis not present

## 2023-03-21 DIAGNOSIS — Z0000 Encounter for general adult medical examination without abnormal findings: Secondary | ICD-10-CM | POA: Diagnosis not present

## 2023-03-21 DIAGNOSIS — G40909 Epilepsy, unspecified, not intractable, without status epilepticus: Secondary | ICD-10-CM | POA: Diagnosis not present

## 2023-03-21 DIAGNOSIS — Z125 Encounter for screening for malignant neoplasm of prostate: Secondary | ICD-10-CM | POA: Diagnosis not present

## 2023-03-21 DIAGNOSIS — Z79899 Other long term (current) drug therapy: Secondary | ICD-10-CM | POA: Diagnosis not present

## 2023-03-21 DIAGNOSIS — Z23 Encounter for immunization: Secondary | ICD-10-CM | POA: Diagnosis not present

## 2023-03-21 DIAGNOSIS — E7800 Pure hypercholesterolemia, unspecified: Secondary | ICD-10-CM | POA: Diagnosis not present

## 2023-06-19 DIAGNOSIS — G4719 Other hypersomnia: Secondary | ICD-10-CM | POA: Diagnosis not present

## 2023-06-19 DIAGNOSIS — R0683 Snoring: Secondary | ICD-10-CM | POA: Diagnosis not present

## 2023-06-19 DIAGNOSIS — G4710 Hypersomnia, unspecified: Secondary | ICD-10-CM | POA: Diagnosis not present

## 2023-08-06 DIAGNOSIS — M9903 Segmental and somatic dysfunction of lumbar region: Secondary | ICD-10-CM | POA: Diagnosis not present

## 2023-08-06 DIAGNOSIS — M9902 Segmental and somatic dysfunction of thoracic region: Secondary | ICD-10-CM | POA: Diagnosis not present

## 2023-08-06 DIAGNOSIS — M5386 Other specified dorsopathies, lumbar region: Secondary | ICD-10-CM | POA: Diagnosis not present

## 2023-08-06 DIAGNOSIS — M9905 Segmental and somatic dysfunction of pelvic region: Secondary | ICD-10-CM | POA: Diagnosis not present

## 2023-08-08 DIAGNOSIS — M9905 Segmental and somatic dysfunction of pelvic region: Secondary | ICD-10-CM | POA: Diagnosis not present

## 2023-08-08 DIAGNOSIS — M9903 Segmental and somatic dysfunction of lumbar region: Secondary | ICD-10-CM | POA: Diagnosis not present

## 2023-08-08 DIAGNOSIS — M9902 Segmental and somatic dysfunction of thoracic region: Secondary | ICD-10-CM | POA: Diagnosis not present

## 2023-08-08 DIAGNOSIS — M5386 Other specified dorsopathies, lumbar region: Secondary | ICD-10-CM | POA: Diagnosis not present

## 2023-08-13 DIAGNOSIS — M9905 Segmental and somatic dysfunction of pelvic region: Secondary | ICD-10-CM | POA: Diagnosis not present

## 2023-08-13 DIAGNOSIS — M9902 Segmental and somatic dysfunction of thoracic region: Secondary | ICD-10-CM | POA: Diagnosis not present

## 2023-08-13 DIAGNOSIS — M5386 Other specified dorsopathies, lumbar region: Secondary | ICD-10-CM | POA: Diagnosis not present

## 2023-08-13 DIAGNOSIS — M9903 Segmental and somatic dysfunction of lumbar region: Secondary | ICD-10-CM | POA: Diagnosis not present

## 2023-08-15 DIAGNOSIS — M9905 Segmental and somatic dysfunction of pelvic region: Secondary | ICD-10-CM | POA: Diagnosis not present

## 2023-08-15 DIAGNOSIS — M9902 Segmental and somatic dysfunction of thoracic region: Secondary | ICD-10-CM | POA: Diagnosis not present

## 2023-08-15 DIAGNOSIS — M5386 Other specified dorsopathies, lumbar region: Secondary | ICD-10-CM | POA: Diagnosis not present

## 2023-08-15 DIAGNOSIS — M9903 Segmental and somatic dysfunction of lumbar region: Secondary | ICD-10-CM | POA: Diagnosis not present

## 2023-08-20 DIAGNOSIS — M9903 Segmental and somatic dysfunction of lumbar region: Secondary | ICD-10-CM | POA: Diagnosis not present

## 2023-08-20 DIAGNOSIS — M9902 Segmental and somatic dysfunction of thoracic region: Secondary | ICD-10-CM | POA: Diagnosis not present

## 2023-08-20 DIAGNOSIS — M9905 Segmental and somatic dysfunction of pelvic region: Secondary | ICD-10-CM | POA: Diagnosis not present

## 2023-08-20 DIAGNOSIS — M5386 Other specified dorsopathies, lumbar region: Secondary | ICD-10-CM | POA: Diagnosis not present

## 2023-08-27 DIAGNOSIS — M9905 Segmental and somatic dysfunction of pelvic region: Secondary | ICD-10-CM | POA: Diagnosis not present

## 2023-08-27 DIAGNOSIS — M5386 Other specified dorsopathies, lumbar region: Secondary | ICD-10-CM | POA: Diagnosis not present

## 2023-08-27 DIAGNOSIS — M9902 Segmental and somatic dysfunction of thoracic region: Secondary | ICD-10-CM | POA: Diagnosis not present

## 2023-08-27 DIAGNOSIS — M9903 Segmental and somatic dysfunction of lumbar region: Secondary | ICD-10-CM | POA: Diagnosis not present

## 2023-09-03 DIAGNOSIS — M9905 Segmental and somatic dysfunction of pelvic region: Secondary | ICD-10-CM | POA: Diagnosis not present

## 2023-09-03 DIAGNOSIS — M9903 Segmental and somatic dysfunction of lumbar region: Secondary | ICD-10-CM | POA: Diagnosis not present

## 2023-09-03 DIAGNOSIS — M9902 Segmental and somatic dysfunction of thoracic region: Secondary | ICD-10-CM | POA: Diagnosis not present

## 2023-09-03 DIAGNOSIS — M5386 Other specified dorsopathies, lumbar region: Secondary | ICD-10-CM | POA: Diagnosis not present

## 2023-09-05 DIAGNOSIS — M9905 Segmental and somatic dysfunction of pelvic region: Secondary | ICD-10-CM | POA: Diagnosis not present

## 2023-09-05 DIAGNOSIS — M9902 Segmental and somatic dysfunction of thoracic region: Secondary | ICD-10-CM | POA: Diagnosis not present

## 2023-09-05 DIAGNOSIS — M5386 Other specified dorsopathies, lumbar region: Secondary | ICD-10-CM | POA: Diagnosis not present

## 2023-09-05 DIAGNOSIS — M9903 Segmental and somatic dysfunction of lumbar region: Secondary | ICD-10-CM | POA: Diagnosis not present

## 2023-09-10 DIAGNOSIS — M9903 Segmental and somatic dysfunction of lumbar region: Secondary | ICD-10-CM | POA: Diagnosis not present

## 2023-09-10 DIAGNOSIS — M9902 Segmental and somatic dysfunction of thoracic region: Secondary | ICD-10-CM | POA: Diagnosis not present

## 2023-09-10 DIAGNOSIS — M5386 Other specified dorsopathies, lumbar region: Secondary | ICD-10-CM | POA: Diagnosis not present

## 2023-09-10 DIAGNOSIS — M9905 Segmental and somatic dysfunction of pelvic region: Secondary | ICD-10-CM | POA: Diagnosis not present

## 2023-09-17 DIAGNOSIS — M9902 Segmental and somatic dysfunction of thoracic region: Secondary | ICD-10-CM | POA: Diagnosis not present

## 2023-09-17 DIAGNOSIS — M9905 Segmental and somatic dysfunction of pelvic region: Secondary | ICD-10-CM | POA: Diagnosis not present

## 2023-09-17 DIAGNOSIS — M5386 Other specified dorsopathies, lumbar region: Secondary | ICD-10-CM | POA: Diagnosis not present

## 2023-09-17 DIAGNOSIS — M9903 Segmental and somatic dysfunction of lumbar region: Secondary | ICD-10-CM | POA: Diagnosis not present
